# Patient Record
Sex: Female | Born: 1965 | Race: White | Hispanic: No | Marital: Married | Smoking: Never smoker
Health system: Southern US, Community
[De-identification: ages and names within clinical notes are randomized; demographics above are authoritative.]

## PROBLEM LIST (undated history)

## (undated) DIAGNOSIS — M797 Fibromyalgia: Secondary | ICD-10-CM

## (undated) DIAGNOSIS — G4730 Sleep apnea, unspecified: Secondary | ICD-10-CM

## (undated) DIAGNOSIS — M1990 Unspecified osteoarthritis, unspecified site: Secondary | ICD-10-CM

## (undated) DIAGNOSIS — T4145XA Adverse effect of unspecified anesthetic, initial encounter: Secondary | ICD-10-CM

## (undated) DIAGNOSIS — D649 Anemia, unspecified: Secondary | ICD-10-CM

## (undated) DIAGNOSIS — Z9889 Other specified postprocedural states: Secondary | ICD-10-CM

## (undated) DIAGNOSIS — I10 Essential (primary) hypertension: Secondary | ICD-10-CM

## (undated) DIAGNOSIS — I5030 Unspecified diastolic (congestive) heart failure: Secondary | ICD-10-CM

## (undated) DIAGNOSIS — F329 Major depressive disorder, single episode, unspecified: Secondary | ICD-10-CM

## (undated) DIAGNOSIS — K219 Gastro-esophageal reflux disease without esophagitis: Secondary | ICD-10-CM

## (undated) DIAGNOSIS — M329 Systemic lupus erythematosus, unspecified: Secondary | ICD-10-CM

## (undated) DIAGNOSIS — I2510 Atherosclerotic heart disease of native coronary artery without angina pectoris: Secondary | ICD-10-CM

## (undated) DIAGNOSIS — F419 Anxiety disorder, unspecified: Secondary | ICD-10-CM

## (undated) DIAGNOSIS — F32A Depression, unspecified: Secondary | ICD-10-CM

## (undated) DIAGNOSIS — Z8489 Family history of other specified conditions: Secondary | ICD-10-CM

## (undated) DIAGNOSIS — Z8719 Personal history of other diseases of the digestive system: Secondary | ICD-10-CM

## (undated) DIAGNOSIS — R112 Nausea with vomiting, unspecified: Secondary | ICD-10-CM

## (undated) DIAGNOSIS — T8859XA Other complications of anesthesia, initial encounter: Secondary | ICD-10-CM

## (undated) DIAGNOSIS — R0600 Dyspnea, unspecified: Secondary | ICD-10-CM

## (undated) DIAGNOSIS — IMO0002 Reserved for concepts with insufficient information to code with codable children: Secondary | ICD-10-CM

## (undated) DIAGNOSIS — R011 Cardiac murmur, unspecified: Secondary | ICD-10-CM

## (undated) HISTORY — PX: ENDOMETRIAL ABLATION: SHX621

## (undated) HISTORY — PX: DILATION AND CURETTAGE OF UTERUS: SHX78

## (undated) HISTORY — PX: TONSILLECTOMY: SUR1361

---
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
## 1991-01-28 HISTORY — PX: CHOLECYSTECTOMY: SHX55

## 1992-01-28 HISTORY — PX: WRIST SURGERY: SHX841

## 1994-01-27 HISTORY — PX: DIAGNOSTIC LAPAROSCOPY: SUR761

## 2007-08-12 ENCOUNTER — Ambulatory Visit: Payer: Self-pay | Admitting: Internal Medicine

## 2008-05-28 ENCOUNTER — Ambulatory Visit: Payer: Self-pay | Admitting: Internal Medicine

## 2010-02-07 IMAGING — CR DG CHEST 2V
1 series · 2 of 2 positions shown · non-contrast
Comparison: none

REASON FOR EXAM: cough
COMMENTS:

PROCEDURE:     MDR - MDR CHEST PA(OR AP) AND LATERAL  - August 12, 2007  [DATE]
RESULT:     The lung fields are clear. The heart, mediastinal and osseous
structures show no significant abnormalities.

[Series 1: view not recorded · 0.17mm/px · 2 of 2 slices shown]
[im 1/2]
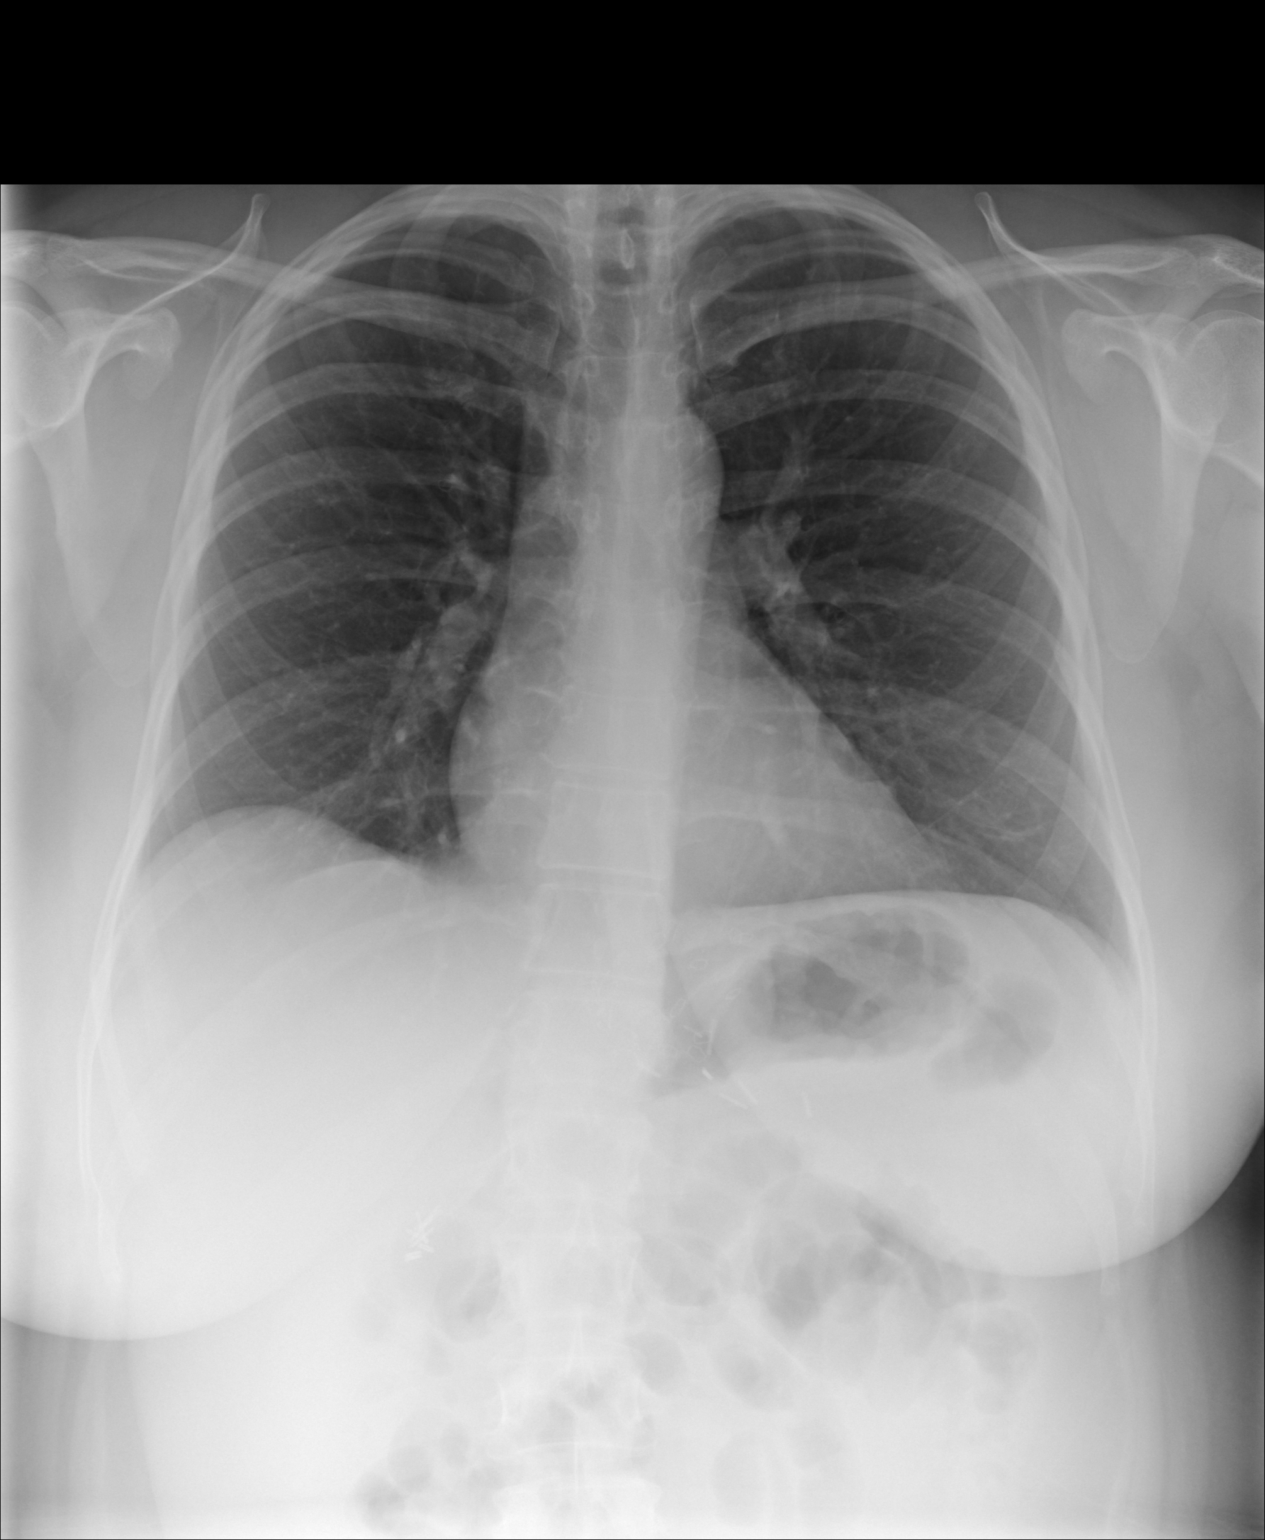
[im 2/2]
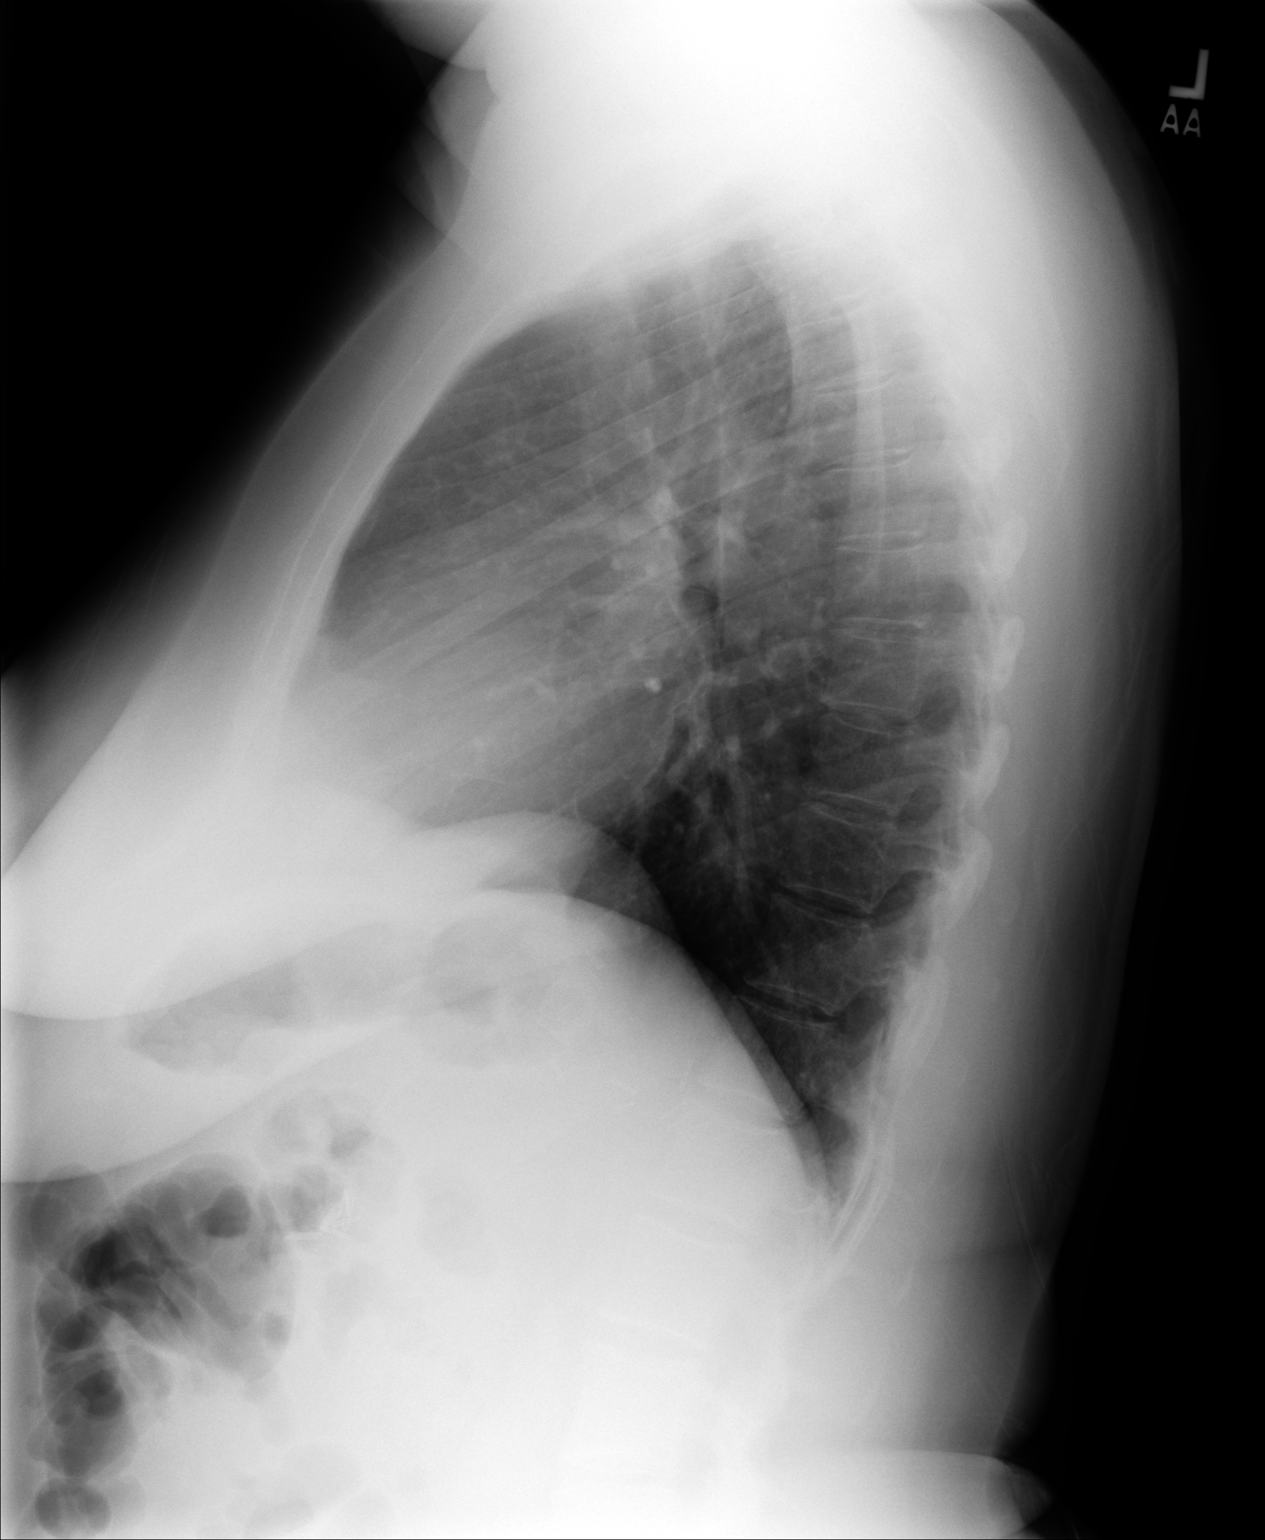

[2 of 2 positions shown; findings below may reference images not displayed]

IMPRESSION: No acute changes are identified.

## 2010-08-09 ENCOUNTER — Ambulatory Visit: Payer: Self-pay | Admitting: Internal Medicine

## 2010-10-03 ENCOUNTER — Ambulatory Visit: Payer: Self-pay | Admitting: Internal Medicine

## 2011-08-04 ENCOUNTER — Ambulatory Visit: Payer: Self-pay | Admitting: Emergency Medicine

## 2012-03-10 ENCOUNTER — Ambulatory Visit: Payer: Self-pay | Admitting: Family Medicine

## 2012-12-07 ENCOUNTER — Ambulatory Visit: Payer: Self-pay | Admitting: Emergency Medicine

## 2014-03-26 ENCOUNTER — Ambulatory Visit: Payer: Self-pay | Admitting: Family Medicine

## 2014-12-23 ENCOUNTER — Ambulatory Visit
Admission: EM | Admit: 2014-12-23 | Discharge: 2014-12-23 | Disposition: A | Discharge status: Home / Self Care | Payer: BLUE CROSS/BLUE SHIELD | Attending: Family Medicine | Admitting: Family Medicine

## 2014-12-23 DIAGNOSIS — J0100 Acute maxillary sinusitis, unspecified: Secondary | ICD-10-CM | POA: Diagnosis not present

## 2014-12-23 DIAGNOSIS — J0110 Acute frontal sinusitis, unspecified: Secondary | ICD-10-CM | POA: Diagnosis not present

## 2014-12-23 HISTORY — DX: Reserved for concepts with insufficient information to code with codable children: IMO0002

## 2014-12-23 HISTORY — DX: Fibromyalgia: M79.7

## 2014-12-23 HISTORY — DX: Essential (primary) hypertension: I10

## 2014-12-23 HISTORY — DX: Systemic lupus erythematosus, unspecified: M32.9

## 2014-12-23 MED ORDER — AZITHROMYCIN 500 MG PO TABS: 500.0000 mg | ORAL_TABLET | Freq: Every day | ORAL | Status: DC

## 2014-12-23 MED ORDER — GUAIFENESIN-CODEINE 100-10 MG/5ML PO SOLN: 5.0000 mL | Freq: Three times a day (TID) | ORAL | Status: AC | PRN

## 2014-12-23 NOTE — ED Provider Notes (Signed)
[facility]  ____________________________________________  Time seen: Approximately 8:33 AM  I have reviewed the triage vital signs and the nursing notes.   HISTORY  Chief Complaint Nasal Congestion   HPI Dana Potts is a 49 y.o. female presents with complaints of 3 weeks of runny nose, nasal congestion, sinus pressure and intermittent cough. Patient reports cough is primarily at night and especially when lying down she can feel the drainage in the back of her throat. Reports current sinus discomfort is described as a pressure and ache at 5 out of 10. Denies pain radiation. Denies dizziness, neck pain, rash, vision changes, chest pain or shortness of breath. Reports continues to eat and drink well.  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   Past Medical History  Diagnosis Date  . Lupus (HCC)   . Fibromyalgia   . Hypertension     There are no active problems to display for this patient.   Past Surgical History  Procedure Laterality Date  . Endometrial ablation    . Cesarean section    . Bunionectomy    . Tonsillectomy      Current Outpatient Rx  Name  Route  Sig  Dispense  Refill  . benazepril-hydrochlorthiazide (LOTENSIN HCT) 20-25 MG tablet   Oral   Take 1 tablet by mouth daily.         . DULoxetine (CYMBALTA) 60 MG capsule   Oral   Take 60 mg by mouth daily.         . folic acid (FOLVITE) 400 MCG tablet   Oral   Take 400 mcg by mouth daily.         . hydroxychloroquine (PLAQUENIL) 200 MG tablet   Oral   Take by mouth 2 (two) times daily.         Marland Kitchen losartan (COZAAR) 25 MG tablet   Oral   Take 25 mg by mouth daily.         . traZODone (DESYREL) 50 MG tablet   Oral   Take 50 mg by mouth at bedtime as needed for sleep.         . vitamin B-12 (CYANOCOBALAMIN) 1000 MCG tablet   Oral   Take 1,000 mcg by mouth daily.         . Vitamin D, Ergocalciferol, (DRISDOL) 50000  UNITS CAPS capsule   Oral   Take 50,000 Units by mouth every 7 (seven) days.           Allergies Penicillins  Penicillin allergy: Reaction "swelling "  Also reports doxycycline: States unable to tolerate due to GI discomfort.  History reviewed. No pertinent family history.  Social History Social History  Substance Use Topics  . Smoking status: Never Smoker   . Smokeless tobacco: None  . Alcohol Use: No    Review of Systems Constitutional: No fever/chills Eyes: No visual changes. ENT: No sore throat. Positive runny nose, congestion and sinus pressure.  Cardiovascular: Denies chest pain. Respiratory: Denies shortness of breath. Gastrointestinal: No abdominal pain.  No nausea, no vomiting.  No diarrhea.  No constipation. Genitourinary: Negative for dysuria. Musculoskeletal: Negative for back pain. Skin: Negative for rash. Neurological: Negative for headaches, focal weakness or numbness.  10-point ROS otherwise negative.  ____________________________________________   PHYSICAL EXAM:  VITAL SIGNS: ED Triage Vitals  Enc Vitals Group     BP 12/23/14 0826 140/73 mmHg     Pulse Rate 12/23/14 0826 81  Resp -- 18     Temp 12/23/14 0826 97.5 F (36.4 C)     Temp Source 12/23/14 0826 Oral     SpO2 12/23/14 0826 98 %     Weight 12/23/14 0826 192 lb (87.091 kg)     Height 12/23/14 0826 5' (1.524 m)     Head Cir --      Peak Flow --      Pain Score 12/23/14 0825 2     Pain Loc --      Pain Edu? --      Excl. in GC? --     Constitutional: Alert and oriented. Well appearing and in no acute distress. Eyes: Conjunctivae are normal. PERRL. EOMI. Head: Atraumatic. Mod TTP bilateral maxillary and frontal sinus, no swelling, no erythema.   Ears: no erythema, normal TMs bilaterally.   Nose: nasal congestion, bilateral nasal turbinate erythema.   Mouth/Throat: Mucous membranes are moist.  Oropharynx non-erythematous.No tonsillar swelling or exudate. No uvular shift or  deviation.  Neck: No stridor.  No cervical spine tenderness to palpation. Hematological/Lymphatic/Immunilogical: No cervical lymphadenopathy. Cardiovascular: Normal rate, regular rhythm. Grossly normal heart sounds.  Good peripheral circulation. Respiratory: Normal respiratory effort.  No retractions. Lungs CTAB.No wheezes, rales or rhonchi. Good air movement.  Gastrointestinal: Soft and nontender. Obese abdomen. Normal Bowel sounds.   No CVA tenderness. Musculoskeletal: No lower or upper extremity tenderness nor edema.  No joint effusions. Bilateral pedal pulses equal and easily palpated.  Neurologic:  Normal speech and language. No gross focal neurologic deficits are appreciated. No gait instability. Skin:  Skin is warm, dry and intact. No rash noted. Psychiatric: Mood and affect are normal. Speech and behavior are normal.  ____________________________________________   LABS (all labs ordered are listed, but only abnormal results are displayed)  Labs Reviewed - No data to display ____________________________________________   INITIAL IMPRESSION / ASSESSMENT AND PLAN / ED COURSE  Pertinent labs & imaging results that were available during my care of the patient were reviewed by me and considered in my medical decision making (see chart for details).  Very well appearing. No acute distress. 3 weeks runny nose, nasal congestion and sinus discomfort. Lungs clear throughout will treat sinusitis with oral azithromycin, and prn guaifenesin/codeine. Supportive treatments including rest, fluids, and pcp follow up.   Discussed follow up with Primary care physician this week. Discussed follow up and return parameters including no resolution or any worsening concerns. Patient verbalized understanding and agreed to plan.   ____________________________________________   FINAL CLINICAL IMPRESSION(S) / ED DIAGNOSES  Final diagnoses:  Acute frontal sinusitis, recurrence not specified  Acute  maxillary sinusitis, recurrence not specified       Renford DillsLindsey Keven Osborn, NP 12/23/14 [phone]

## 2014-12-23 NOTE — Discharge Instructions (Signed)
Take medication as prescribed. Rest. Drink plenty of fluids.  ° °Follow up with your primary care physician this week as needed. Return to Urgent care as needed for new or worsening concerns.  ° °Sinusitis, Adult °Sinusitis is redness, soreness, and inflammation of the paranasal sinuses. Paranasal sinuses are air pockets within the bones of your face. They are located beneath your eyes, in the middle of your forehead, and above your eyes. In healthy paranasal sinuses, mucus is able to drain out, and air is able to circulate through them by way of your nose. However, when your paranasal sinuses are inflamed, mucus and air can become trapped. This can allow bacteria and other germs to grow and cause infection. °Sinusitis can develop quickly and last only a short time (acute) or continue over a long period (chronic). Sinusitis that lasts for more than 12 weeks is considered chronic. °CAUSES °Causes of sinusitis include: °· Allergies. °· Structural abnormalities, such as displacement of the cartilage that separates your nostrils (deviated septum), which can decrease the air flow through your nose and sinuses and affect sinus drainage. °· Functional abnormalities, such as when the small hairs (cilia) that line your sinuses and help remove mucus do not work properly or are not present. °SIGNS AND SYMPTOMS °Symptoms of acute and chronic sinusitis are the same. The primary symptoms are pain and pressure around the affected sinuses. Other symptoms include: °· Upper toothache. °· Earache. °· Headache. °· Bad breath. °· Decreased sense of smell and taste. °· A cough, which worsens when you are lying flat. °· Fatigue. °· Fever. °· Thick drainage from your nose, which often is green and may contain pus (purulent). °· Swelling and warmth over the affected sinuses. °DIAGNOSIS °Your health care provider will perform a physical exam. During your exam, your health care provider may perform any of the following to help determine if  you have acute sinusitis or chronic sinusitis: °· Look in your nose for signs of abnormal growths in your nostrils (nasal polyps). °· Tap over the affected sinus to check for signs of infection. °· View the inside of your sinuses using an imaging device that has a light attached (endoscope). °If your health care provider suspects that you have chronic sinusitis, one or more of the following tests may be recommended: °· Allergy tests. °· Nasal culture. A sample of mucus is taken from your nose, sent to a lab, and screened for bacteria. °· Nasal cytology. A sample of mucus is taken from your nose and examined by your health care provider to determine if your sinusitis is related to an allergy. °TREATMENT °Most cases of acute sinusitis are related to a viral infection and will resolve on their own within 10 days. Sometimes, medicines are prescribed to help relieve symptoms of both acute and chronic sinusitis. These may include pain medicines, decongestants, nasal steroid sprays, or saline sprays. °However, for sinusitis related to a bacterial infection, your health care provider will prescribe antibiotic medicines. These are medicines that will help kill the bacteria causing the infection. °Rarely, sinusitis is caused by a fungal infection. In these cases, your health care provider will prescribe antifungal medicine. °For some cases of chronic sinusitis, surgery is needed. Generally, these are cases in which sinusitis recurs more than 3 times per year, despite other treatments. °HOME CARE INSTRUCTIONS °· Drink plenty of water. Water helps thin the mucus so your sinuses can drain more easily. °· Use a humidifier. °· Inhale steam 3-4 times a day (for example, sit   in the bathroom with the shower running). °· Apply a warm, moist washcloth to your face 3-4 times a day, or as directed by your health care provider. °· Use saline nasal sprays to help moisten and clean your sinuses. °· Take medicines only as directed by your  health care provider. °· If you were prescribed either an antibiotic or antifungal medicine, finish it all even if you start to feel better. °SEEK IMMEDIATE MEDICAL CARE IF: °· You have increasing pain or severe headaches. °· You have nausea, vomiting, or drowsiness. °· You have swelling around your face. °· You have vision problems. °· You have a stiff neck. °· You have difficulty breathing. °  °This information is not intended to replace advice given to you by your health care provider. Make sure you discuss any questions you have with your health care provider. °  °Document Released: 01/13/2005 Document Revised: 02/03/2014 Document Reviewed: 01/28/2011 °Elsevier Interactive Patient Education ©2016 [employer] ° °

## 2015-07-08 ENCOUNTER — Ambulatory Visit
Admission: EM | Admit: 2015-07-08 | Discharge: 2015-07-08 | Disposition: A | Discharge status: Home / Self Care | Payer: BLUE CROSS/BLUE SHIELD | Attending: Family Medicine | Admitting: Family Medicine

## 2015-07-08 DIAGNOSIS — J0100 Acute maxillary sinusitis, unspecified: Secondary | ICD-10-CM | POA: Diagnosis not present

## 2015-07-08 MED ORDER — AZITHROMYCIN 250 MG PO TABS: ORAL_TABLET | ORAL | Status: DC

## 2015-07-08 NOTE — ED Provider Notes (Signed)
CSN: [id number]     Arrival date & time 07/08/15  1011 History   First MD Initiated Contact with Patient 07/08/15 1038     Chief Complaint  Patient presents with  . Sinusitis   (Consider location/radiation/quality/duration/timing/severity/associated sxs/prior Treatment) Patient is a 50 y.o. female presenting with URI. The history is provided by the patient.  URI Presenting symptoms: congestion, cough, facial pain, fatigue and fever   Severity:  Moderate Onset quality:  Sudden Duration:  2 weeks Timing:  Constant Progression:  Worsening Chronicity:  New Relieved by:  Nothing Ineffective treatments:  OTC medications Associated symptoms: headaches and sinus pain   Associated symptoms: no myalgias, no swollen glands and no wheezing   Risk factors: immunosuppression (immunosuppresants for lupus)   Risk factors: not elderly, no chronic cardiac disease, no chronic kidney disease, no chronic respiratory disease, no diabetes mellitus, no recent illness, no recent travel and no sick contacts     Past Medical History  Diagnosis Date  . Lupus (HCC)   . Fibromyalgia   . Hypertension    Past Surgical History  Procedure Laterality Date  . Endometrial ablation    . Cesarean section    . Bunionectomy    . Tonsillectomy     Family History  Problem Relation Age of Onset  . Lung cancer Father   . Hypothyroidism Mother   . Anxiety disorder Mother   . Hypertension Mother   . Glaucoma Mother    Social History  Substance Use Topics  . Smoking status: Never Smoker   . Smokeless tobacco: None  . Alcohol Use: No   OB History    No data available     Review of Systems  Constitutional: Positive for fever and fatigue.  HENT: Positive for congestion.   Respiratory: Positive for cough. Negative for wheezing.   Musculoskeletal: Negative for myalgias.  Neurological: Positive for headaches.    Allergies  Penicillins  Home Medications   Prior to Admission medications   Medication  Sig Start Date End Date Taking? Authorizing Provider  benazepril-hydrochlorthiazide (LOTENSIN HCT) 20-25 MG tablet Take 1 tablet by mouth daily.   Yes Historical Provider, MD  DULoxetine (CYMBALTA) 60 MG capsule Take 60 mg by mouth daily.   Yes Historical Provider, MD  folic acid (FOLVITE) 400 MCG tablet Take 400 mcg by mouth daily.   Yes Historical Provider, MD  hydroxychloroquine (PLAQUENIL) 200 MG tablet Take by mouth 2 (two) times daily.   Yes Historical Provider, MD  losartan (COZAAR) 25 MG tablet Take 25 mg by mouth daily.   Yes Historical Provider, MD  Methotrexate, PF, 25 MG/0.4ML SOAJ Inject into the skin.   Yes Historical Provider, MD  traZODone (DESYREL) 50 MG tablet Take 50 mg by mouth at bedtime as needed for sleep.   Yes Historical Provider, MD  vitamin B-12 (CYANOCOBALAMIN) 1000 MCG tablet Take 1,000 mcg by mouth daily.   Yes Historical Provider, MD  Vitamin D, Ergocalciferol, (DRISDOL) 50000 UNITS CAPS capsule Take 50,000 Units by mouth every 7 (seven) days.   Yes Historical Provider, MD  azithromycin (ZITHROMAX Z-PAK) 250 MG tablet 2 tabs po once day 1, then 1 tab po qd for next 4 days 07/08/15   Payton Mccallumrlando Yiannis Tulloch, MD  guaiFENesin-codeine 100-10 MG/5ML syrup Take 5 mLs by mouth 3 (three) times daily as needed for cough. 12/23/14   Renford DillsLindsey Miller, NP   Meds Ordered and Administered this Visit  Medications - No data to display  BP 128/75 mmHg  Pulse 77  Temp(Src) 97.2 F (36.2 C) (Tympanic)  Resp 18  Ht 5' (1.524 m)  Wt 186 lb (84.369 kg)  BMI 36.33 kg/m2  SpO2 99% No data found.   Physical Exam  Constitutional: She appears well-developed and well-nourished. No distress.  HENT:  Head: Normocephalic and atraumatic.  Right Ear: Tympanic membrane, external ear and ear canal normal.  Left Ear: Tympanic membrane, external ear and ear canal normal.  Nose: Mucosal edema and rhinorrhea present. No nose lacerations, sinus tenderness, nasal deformity, septal deviation or nasal  septal hematoma. No epistaxis.  No foreign bodies. Right sinus exhibits maxillary sinus tenderness and frontal sinus tenderness. Left sinus exhibits maxillary sinus tenderness and frontal sinus tenderness.  Mouth/Throat: Uvula is midline, oropharynx is clear and moist and mucous membranes are normal. No oropharyngeal exudate.  Eyes: Conjunctivae and EOM are normal. Pupils are equal, round, and reactive to light. Right eye exhibits no discharge. Left eye exhibits no discharge. No scleral icterus.  Neck: Normal range of motion. Neck supple. No thyromegaly present.  Cardiovascular: Normal rate, regular rhythm and normal heart sounds.   Pulmonary/Chest: Effort normal and breath sounds normal. No respiratory distress. She has no wheezes. She has no rales.  Lymphadenopathy:    She has no cervical adenopathy.  Skin: She is not diaphoretic.  Nursing note and vitals reviewed.   ED Course  Procedures (including critical care time)  Labs Review Labs Reviewed - No data to display  Imaging Review No results found.   Visual Acuity Review  Right Eye Distance:   Left Eye Distance:   Bilateral Distance:    Right Eye Near:   Left Eye Near:    Bilateral Near:         MDM   1. Acute maxillary sinusitis, recurrence not specified    Discharge Medication List as of 07/08/2015 10:49 AM     1.diagnosis reviewed with patient 2. rx as per orders above; reviewed possible side effects, interactions, risks and benefits; zithromax as per orders 3.Follow-up prn if symptoms worsen or don't improve    Payton Mccallum, MD 07/08/15 1140

## 2015-07-08 NOTE — Discharge Instructions (Signed)

## 2015-07-08 NOTE — ED Notes (Signed)
Patient complains of sinus pain and pressure, sore throat, cough- with productivity, runny nose. Patient states that this has been occurring for 1.5 weeks.

## 2016-02-26 ENCOUNTER — Ambulatory Visit (INDEPENDENT_AMBULATORY_CARE_PROVIDER_SITE_OTHER): Payer: BLUE CROSS/BLUE SHIELD | Admitting: Podiatry

## 2016-02-26 ENCOUNTER — Ambulatory Visit (INDEPENDENT_AMBULATORY_CARE_PROVIDER_SITE_OTHER): Payer: BLUE CROSS/BLUE SHIELD

## 2016-02-26 ENCOUNTER — Encounter: Payer: Self-pay | Admitting: Podiatry

## 2016-02-26 DIAGNOSIS — M2042 Other hammer toe(s) (acquired), left foot: Secondary | ICD-10-CM | POA: Diagnosis not present

## 2016-02-26 DIAGNOSIS — M2012 Hallux valgus (acquired), left foot: Secondary | ICD-10-CM | POA: Diagnosis not present

## 2016-02-26 DIAGNOSIS — M21612 Bunion of left foot: Secondary | ICD-10-CM | POA: Diagnosis not present

## 2016-02-26 NOTE — Patient Instructions (Signed)

## 2016-03-08 NOTE — Progress Notes (Signed)
   Subjective:  Patient presents today as a new patient for evaluation of bunions to the left foot. Patient is otherwise healthy and states that when she was 51 years old she had surgery performed by an orthopedist. Patient has complained of this painful bunion deformity for several years now without any alleviation of symptoms for the patient regarding conservative modalities which would include shoe gear modifications and padding. Patient is unable walk great distances without pain.    Objective/Physical Exam General: The patient is alert and oriented x3 in no acute distress.  Dermatology: Skin is warm, dry and supple bilateral lower extremities. Negative for open lesions or macerations.  Vascular: Palpable pedal pulses bilaterally. No edema or erythema noted. Capillary refill within normal limits.  Neurological: Epicritic and protective threshold grossly intact bilaterally.   Musculoskeletal Exam: Clinical evidence of bunion deformity noted to the left foot with hammertoe contracture second digit left foot. There is limited range of motion of the first MPJ left foot.  Radiographic Exam:  Hallux abductovalgus deformity noted with an intermetatarsal angle greater than 15. Hallux abductus angle greater than 30. 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 #2 today we discussed conservative versus surgical management of bunion and hammertoe deformity. Due to the fact that the patient has failed conservative modalities at home to alleviate symptoms she opts for surgical correction. All possible complications and details of the procedure were explained. All patient questions were answered. No guarantees were expressed or implied. #3 authorization for surgery was initiated today. Surgery will consist of bunionectomy with metatarsal osteotomy  left. PIPJ arthroplasty second digit left. Second MPJ capsulotomy left. #4 return to clinic 1 week postop  [place]   Savva Beamer M. Aviannah Castoro, [place] [facility]  Dr. Felecia ShellingBrent M. Luciano Cinquemani, DPM    [address]                                        [place], [place] [address]                Office [phone]  Fax [phone]

## 2016-08-12 ENCOUNTER — Ambulatory Visit: Payer: BLUE CROSS/BLUE SHIELD | Admitting: Podiatry

## 2016-09-08 ENCOUNTER — Ambulatory Visit (INDEPENDENT_AMBULATORY_CARE_PROVIDER_SITE_OTHER): Payer: BLUE CROSS/BLUE SHIELD

## 2016-09-08 ENCOUNTER — Encounter: Payer: Self-pay | Admitting: Podiatry

## 2016-09-08 ENCOUNTER — Ambulatory Visit (INDEPENDENT_AMBULATORY_CARE_PROVIDER_SITE_OTHER): Payer: BLUE CROSS/BLUE SHIELD | Admitting: Podiatry

## 2016-09-08 DIAGNOSIS — M2012 Hallux valgus (acquired), left foot: Secondary | ICD-10-CM

## 2016-09-08 DIAGNOSIS — M21612 Bunion of left foot: Secondary | ICD-10-CM

## 2016-09-08 DIAGNOSIS — M2042 Other hammer toe(s) (acquired), left foot: Secondary | ICD-10-CM | POA: Diagnosis not present

## 2016-09-08 NOTE — Progress Notes (Signed)
   Subjective:  51 year old female presents today for follow-up evaluation regarding bunions and hammertoes to the left foot. Patient states that she when she was partially 51 years old she had bunion surgery performed by an orthopedist. Patient has complained of painful bunion deformity for several years now without any alleviation of symptoms despite all conservative modalities. Patient states that over the past 6 months she had family medical issues and has been unable to have surgery. Patient presents today for follow-up treatment and evaluation   Objective/Physical Exam General: The patient is alert and oriented x3 in no acute distress.  Dermatology: Skin is warm, dry and supple bilateral lower extremities. Negative for open lesions or macerations.  Vascular: Palpable pedal pulses bilaterally. No edema or erythema noted. Capillary refill within normal limits.  Neurological: Epicritic and protective threshold grossly intact bilaterally.   Musculoskeletal Exam: Clinical evidence of bunion deformity noted to the left foot with hammertoe contracture second digit left foot. There is limited range of motion of the first MPJ left foot.  Radiographic Exam:  Hallux abductovalgus deformity noted with an intermetatarsal angle greater than 15. Hallux abductus angle greater than 30. 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 #2 today we discussed conservative versus surgical management of bunion and hammertoe deformity again . All possible complications and details of the procedure were explained. All patient questions were answered. No guarantees were expressed or implied. #3 authorization for surgery was re-initiated today. Surgery will consist of bunionectomy with metatarsal osteotomy left. PIPJ arthroplasty second digit left. Second MPJ  capsulotomy left. possible Weil osteotomy second metatarsal left foot #4 return to clinic 1 week postop  [place] [facility]   Brent M. Evans, [place] [facility]  Dr. Felecia ShellingBrent M. Evans, DPM    [address]                                        [place], [place] [address]                Office [phone]  Fax [phone]

## 2016-09-11 ENCOUNTER — Telehealth: Payer: Self-pay | Admitting: Podiatry

## 2016-09-11 NOTE — Telephone Encounter (Signed)
I'm scheduled to have surgery on 13 September and [employer] and [employer] Pouchet[employer] will be faxing over her paperwork. She would like for you to call her when you have completed it so she can pay the $25.00 fee. Thank you.

## 2016-09-23 DIAGNOSIS — M79676 Pain in unspecified toe(s): Secondary | ICD-10-CM

## 2016-10-07 ENCOUNTER — Telehealth: Payer: Self-pay | Admitting: *Deleted

## 2016-10-07 NOTE — Telephone Encounter (Signed)
"  I just left the hospital.  I need to cancel my surgery for Thursday.  They said something is wrong with my heart.  I am in shock.  I don't want anything to happen while I'm having the surgery.  I don't know what I need to do."  I will let Dr. Logan BoresEvans know and call the surgical center and cancel your surgery.  "I hate to do this to you all."  No apology needed, your health is the main concern.  I hope everything goes well for you.  [facility], I canceled patient's surgery for 10/09/2016.

## 2016-10-17 ENCOUNTER — Encounter: Payer: BLUE CROSS/BLUE SHIELD | Admitting: Podiatry

## 2016-10-24 ENCOUNTER — Encounter: Payer: BLUE CROSS/BLUE SHIELD | Admitting: Podiatry

## 2016-10-27 HISTORY — PX: CARDIAC CATHETERIZATION: SHX172

## 2016-11-24 ENCOUNTER — Telehealth: Payer: Self-pay | Admitting: *Deleted

## 2016-11-24 NOTE — Telephone Encounter (Signed)
"  I was supposed to have surgery on my foot with Dr. Logan BoresEvans on September 13.  I had to cancel due to some medical problems that got resolved.  I didn't know if you could call me back because I didn't know if I have to go through everything or if I could just schedule the surgery.  Call me back."

## 2016-11-25 ENCOUNTER — Telehealth: Payer: Self-pay | Admitting: Podiatry

## 2016-11-25 NOTE — Telephone Encounter (Signed)
Patient called and said that she has left 3 messages for someone to call her back about scheduling surgery since Friday. Apparently she was scheduled for 10/09/2016 and had to cancel that because of a heart problem she was having but that she has now been cleared from that and is ready to get her surgery scheduled. Patient states she has met her deductible and would like to get this done before year end.

## 2016-11-26 NOTE — Telephone Encounter (Signed)
I attempted to return her call.  I left her a message to call me back tomorrow. 

## 2016-11-27 NOTE — Telephone Encounter (Signed)
I'm returning your call.  Do you have a date in mind that you would like to do your surgery on?  "I would like to do it as soon as possible.  Can he do it on November 29?"  Yes, he can do it on November 29.    "I'm sorry to bother you again.  When I was at my heart doctor, he had said that everything would be fine for me to have the surgery but he would prefer I have it done in the hospital.  I had mentioned it to whomever I had spoken with earlier this week but I forgot to mention it to you.  I don't know if that will change the date or will I need to call somewhere else.  Let me know please."

## 2016-11-27 NOTE — Telephone Encounter (Signed)
I'm returning your call.  He may be able to do your surgery at [facility] on November 29.  I have to call and see if they have any time available.  "Since it's going to be done at [facility], can he do it any sooner?"  He may be able to do it on November 15 but have you had a physical within the past 30 days by your primary care physician?  [facility] requires a history and physical form be completed prior to any surgeries.  "I have seen my primary care doctor about five times within last month."  You will need to get a history and physical form from our office and have your primary care doctor fill it out and fax to the surgery center.  I will call on Monday to see if they have any time available on November 15.  If I have any problems, I will call and let you know.  "Okay, I will go by the [place] office to pick up the forms tomorrow."

## 2016-11-27 NOTE — Telephone Encounter (Signed)
"  I got a phone call from you last night.  I'm sorry I missed your call.  If you would, call me back please."

## 2016-12-03 NOTE — Telephone Encounter (Signed)
I'm calling to let you know we were able to get you scheduled for November 15 for your surgery.  It will be at the [facility] OR.  "Okay, sounds good.  I have questions about my FMLA.  I would think we would have to do all that over again.  I'm going to check with my job and see what they say."  You will need to speak to Algis GreenhouseJanet Albert regarding your FMLA.  "How do I contact her?"  You can just call the main office number and ask for her.

## 2016-12-04 NOTE — Telephone Encounter (Signed)
Forms placed at front desk for patient to pick up

## 2016-12-05 ENCOUNTER — Telehealth: Payer: Self-pay | Admitting: *Deleted

## 2016-12-05 NOTE — Telephone Encounter (Signed)
"  This is Medical illustratorTeresa RN for [facility] Pre-admission Testing.  Patient of Dr. Logan BoresEvans is coming in on Tuesday for a pre-admission appointment on Tuesday the 13th.  There are no pre-op orders in epic.  They need to be put in as soon as possible.  We have to have these orders in so we can act upon them on her pre-admission visit.  The patient's name is Dana Potts.  Her date of surgery is the 15th but her pre-admission is the 13th.  Please have Dr. Logan BoresEvans get orders in as soon as possible."

## 2016-12-08 ENCOUNTER — Telehealth: Payer: Self-pay | Admitting: *Deleted

## 2016-12-08 ENCOUNTER — Telehealth: Payer: Self-pay | Admitting: Podiatry

## 2016-12-08 NOTE — Telephone Encounter (Signed)
Hey, my name is Dana Potts. I'm calling from [facility]. This pt is scheduled for surgery on Thursday 15 November and she is scheduled to come in tomorrow for her pre-op appointment. We currently do not have orders for this pt. If you have any questions, please give us a call back at [phone]. Thank you.

## 2016-12-08 NOTE — Telephone Encounter (Signed)
Pre-op orders placed

## 2016-12-08 NOTE — Telephone Encounter (Signed)
"  I'm calling about Dana Potts, a patient of Dr. Logan BoresEvans whose going to have surgery on the 15th of November but they are coming in for their pre-op appointment tomorrow, the 13.  We currently don't have orders for this patient.  If you have any questions give us a call back."

## 2016-12-08 NOTE — Pre-Procedure Instructions (Signed)
Dana Potts  12/08/2016      [id number] [place] [place] - [address] [place] [address] Phone: [phone] Fax: [phone]    Your procedure is scheduled on November 15  [facility] to [facility] Admitting at 1300 P.M.  Call this number if you have problems the morning of surgery:  [phone]   Remember:  Do not eat food or drink liquids after midnight.  Continue all other medications as directed by your physician except follow these medication instructions before surgery   Take these medicines the morning of surgery with A SIP OF WATER  clonazePAM (KLONOPIN)  metoprolol succinate (TOPROL-XL)  omeprazole (PRILOSEC)   7 days prior to surgery STOP taking any Aspirin (unless otherwise instructed by your surgeon), Aleve, Naproxen, Ibuprofen, Motrin, Advil, Goody's, BC's, all herbal medications, fish oil, and all vitamins    Do not wear jewelry, make-up or nail polish.  Do not wear lotions, powders, or perfumes, or deoderant.  Do not shave 48 hours prior to surgery.    Do not bring valuables to the hospital.  [facility] is not responsible for any belongings or valuables.  Contacts, dentures or bridgework may not be worn into surgery.  Leave your suitcase in the car.  After surgery it may be brought to your room.  For patients admitted to the hospital, discharge time will be determined by your treatment team.  Patients discharged the day of surgery will not be allowed to drive home.    Special instructions:   [place]- Preparing For Surgery  Before surgery, you can play an important role. Because skin is not sterile, your skin needs to be as free of germs as possible. You can reduce the number of germs on your skin by washing with CHG (chlorahexidine gluconate) Soap before surgery.  CHG is an antiseptic cleaner which kills germs and bonds with the skin to continue killing germs even after washing.  Please do not use  if you have an allergy to CHG or antibacterial soaps. If your skin becomes reddened/irritated stop using the CHG.  Do not shave (including legs and underarms) for at least 48 hours prior to first CHG shower. It is OK to shave your face.  Please follow these instructions carefully.   1. Shower the NIGHT BEFORE SURGERY and the MORNING OF SURGERY with CHG.   2. If you chose to wash your hair, wash your hair first as usual with your normal shampoo.  3. After you shampoo, rinse your hair and body thoroughly to remove the shampoo.  4. Use CHG as you would any other liquid soap. You can apply CHG directly to the skin and wash gently with a scrungie or a clean washcloth.   5. Apply the CHG Soap to your body ONLY FROM THE NECK DOWN.  Do not use on open wounds or open sores. Avoid contact with your eyes, ears, mouth and genitals (private parts). Wash Face and genitals (private parts)  with your normal soap.  6. Wash thoroughly, paying special attention to the area where your surgery will be performed.  7. Thoroughly rinse your body with warm water from the neck down.  8. DO NOT shower/wash with your normal soap after using and rinsing off the CHG Soap.  9. Pat yourself dry with a CLEAN TOWEL.  10. Wear CLEAN PAJAMAS to bed the night before surgery, wear comfortable clothes the morning of surgery  11. Place CLEAN SHEETS  on your bed the night of your first shower and DO NOT SLEEP WITH PETS.    Day of Surgery: Do not apply any deodorants/lotions. Please wear clean clothes to the hospital/surgery center.      Please read over the following fact sheets that you were given.

## 2016-12-09 ENCOUNTER — Other Ambulatory Visit: Payer: Self-pay

## 2016-12-09 ENCOUNTER — Encounter (HOSPITAL_COMMUNITY): Payer: Self-pay

## 2016-12-09 ENCOUNTER — Encounter (HOSPITAL_COMMUNITY)
Admission: RE | Admit: 2016-12-09 | Discharge: 2016-12-09 | Disposition: A | Discharge status: Home / Self Care | Payer: BLUE CROSS/BLUE SHIELD | Source: Ambulatory Visit | Attending: Podiatry | Admitting: Podiatry

## 2016-12-09 DIAGNOSIS — I110 Hypertensive heart disease with heart failure: Secondary | ICD-10-CM | POA: Diagnosis not present

## 2016-12-09 DIAGNOSIS — M797 Fibromyalgia: Secondary | ICD-10-CM | POA: Diagnosis not present

## 2016-12-09 DIAGNOSIS — G4730 Sleep apnea, unspecified: Secondary | ICD-10-CM | POA: Diagnosis not present

## 2016-12-09 DIAGNOSIS — K219 Gastro-esophageal reflux disease without esophagitis: Secondary | ICD-10-CM | POA: Diagnosis not present

## 2016-12-09 DIAGNOSIS — I509 Heart failure, unspecified: Secondary | ICD-10-CM | POA: Diagnosis not present

## 2016-12-09 DIAGNOSIS — M2012 Hallux valgus (acquired), left foot: Secondary | ICD-10-CM | POA: Diagnosis not present

## 2016-12-09 DIAGNOSIS — D649 Anemia, unspecified: Secondary | ICD-10-CM | POA: Diagnosis not present

## 2016-12-09 DIAGNOSIS — M2042 Other hammer toe(s) (acquired), left foot: Secondary | ICD-10-CM | POA: Diagnosis present

## 2016-12-09 DIAGNOSIS — I2510 Atherosclerotic heart disease of native coronary artery without angina pectoris: Secondary | ICD-10-CM | POA: Diagnosis not present

## 2016-12-09 DIAGNOSIS — M1990 Unspecified osteoarthritis, unspecified site: Secondary | ICD-10-CM | POA: Diagnosis not present

## 2016-12-09 DIAGNOSIS — K449 Diaphragmatic hernia without obstruction or gangrene: Secondary | ICD-10-CM | POA: Diagnosis not present

## 2016-12-09 HISTORY — DX: Family history of other specified conditions: Z84.89

## 2016-12-09 HISTORY — DX: Other complications of anesthesia, initial encounter: T88.59XA

## 2016-12-09 HISTORY — DX: Unspecified osteoarthritis, unspecified site: M19.90

## 2016-12-09 HISTORY — DX: Anxiety disorder, unspecified: F41.9

## 2016-12-09 HISTORY — DX: Sleep apnea, unspecified: G47.30

## 2016-12-09 HISTORY — DX: Depression, unspecified: F32.A

## 2016-12-09 HISTORY — DX: Dyspnea, unspecified: R06.00

## 2016-12-09 HISTORY — DX: Adverse effect of unspecified anesthetic, initial encounter: T41.45XA

## 2016-12-09 HISTORY — DX: Atherosclerotic heart disease of native coronary artery without angina pectoris: I25.10

## 2016-12-09 HISTORY — DX: Other specified postprocedural states: Z98.890

## 2016-12-09 HISTORY — DX: Major depressive disorder, single episode, unspecified: F32.9

## 2016-12-09 HISTORY — DX: Personal history of other diseases of the digestive system: Z87.19

## 2016-12-09 HISTORY — DX: Nausea with vomiting, unspecified: R11.2

## 2016-12-09 HISTORY — DX: Cardiac murmur, unspecified: R01.1

## 2016-12-09 HISTORY — DX: Gastro-esophageal reflux disease without esophagitis: K21.9

## 2016-12-09 HISTORY — DX: Anemia, unspecified: D64.9

## 2016-12-09 LAB — BASIC METABOLIC PANEL
Anion gap: 9 (ref 5–15)
BUN: 12 mg/dL (ref 6–20)
CO2: 27 mmol/L (ref 22–32)
Calcium: 9.1 mg/dL (ref 8.9–10.3)
Chloride: 104 mmol/L (ref 101–111)
Creatinine, Ser: 0.79 mg/dL (ref 0.44–1.00)
GFR calc Af Amer: >60 mL/min (ref >60)
Glucose, Bld: 93 mg/dL (ref 65–99)
POTASSIUM: 3.5 mmol/L (ref 3.5–5.1)
SODIUM: 140 mmol/L (ref 135–145)

## 2016-12-09 LAB — CBC
HEMATOCRIT: 43.1 % (ref 36.0–46.0)
Hemoglobin: 14.7 g/dL (ref 12.0–15.0)
MCH: 30.9 pg (ref 26.0–34.0)
MCHC: 34.1 g/dL (ref 30.0–36.0)
MCV: 90.5 fL (ref 78.0–100.0)
Platelets: 325 10*3/uL (ref 150–400)
RBC: 4.76 MIL/uL (ref 3.87–5.11)
RDW: 12.6 % (ref 11.5–15.5)
WBC: 7.7 10*3/uL (ref 4.0–10.5)

## 2016-12-09 NOTE — Progress Notes (Signed)
Started Lexapro 12/08/2016, instructed to take DOS.  Pt. Denies any changes in her breathing/ chest at present.  Requesting EKG tracing from Dr. Harriett RushS. Wood, via fax. Call to A. Kabbe,DNP- chart will be reviewed. Cardiac cath done recently as well as sleep eval.- available in Care everywhere.

## 2016-12-09 NOTE — Pre-Procedure Instructions (Signed)
Tommi EmeryMichele Armbruster Kanner  12/09/2016      [id number] [place], [place] - [address] [place] [address] Phone: [phone] Fax: [phone]    Your procedure is scheduled on November 15  [facility] Ad[facility] at 1300 P.M.  Call this number if you have problems the morning of surgery:  [phone]   Remember:  Do not eat food or drink liquids after midnight.  Continue all other medications as directed by your physician except follow these medication instructions before surgery   Take these medicines the morning of surgery with A SIP OF WATER: clonazePAM (KLONOPIN)  omeprazole (PRILOSEC)   7 days prior to surgery STOP taking any Aspirin (unless otherwise instructed by your surgeon), Aleve, Naproxen, Ibuprofen, Motrin, Advil, Goody's, BC's, all herbal medications, fish oil, and all vitamins    Do not wear jewelry, make-up or nail polish.  Do not wear lotions, powders, or perfumes, or deoderant.  Do not shave 48 hours prior to surgery.    Do not bring valuables to the hospital.  [facility] is not responsible for any belongings or valuables.  Contacts, dentures or bridgework may not be worn into surgery.  Leave your suitcase in the car.  After surgery it may be brought to your room.  For patients admitted to the hospital, discharge time will be determined by your treatment team.  Patients discharged the day of surgery will not be allowed to drive home.    Special instructions:   [place]- Preparing For Surgery  Before surgery, you can play an important role. Because skin is not sterile, your skin needs to be as free of germs as possible. You can reduce the number of germs on your skin by washing with CHG (chlorahexidine gluconate) Soap before surgery.  CHG is an antiseptic cleaner which kills germs and bonds with the skin to continue killing germs even after washing.  Please do not use if you have an allergy to CHG or  antibacterial soaps. If your skin becomes reddened/irritated stop using the CHG.  Do not shave (including legs and underarms) for at least 48 hours prior to first CHG shower. It is OK to shave your face.  Please follow these instructions carefully.   1. Shower the NIGHT BEFORE SURGERY and the MORNING OF SURGERY with CHG.   2. If you chose to wash your hair, wash your hair first as usual with your normal shampoo.  3. After you shampoo, rinse your hair and body thoroughly to remove the shampoo.  4. Use CHG as you would any other liquid soap. You can apply CHG directly to the skin and wash gently with a scrungie or a clean washcloth.   5. Apply the CHG Soap to your body ONLY FROM THE NECK DOWN.  Do not use on open wounds or open sores. Avoid contact with your eyes, ears, mouth and genitals (private parts). Wash Face and genitals (private parts)  with your normal soap.  6. Wash thoroughly, paying special attention to the area where your surgery will be performed.  7. Thoroughly rinse your body with warm water from the neck down.  8. DO NOT shower/wash with your normal soap after using and rinsing off the CHG Soap.  9. Pat yourself dry with a CLEAN TOWEL.  10. Wear CLEAN PAJAMAS to bed the night before surgery, wear comfortable clothes the morning of surgery  11. Place CLEAN SHEETS on your bed the night  of your first shower and DO NOT SLEEP WITH PETS.    Day of Surgery: Do not apply any deodorants/lotions. Please wear clean clothes to the hospital/surgery center.      Please read over the following fact sheets that you were given.

## 2016-12-10 ENCOUNTER — Encounter (HOSPITAL_COMMUNITY): Payer: Self-pay

## 2016-12-10 NOTE — Progress Notes (Addendum)
Anesthesia Chart Review: Patient is a 51 year old female scheduled for double ostectomy, hammer toe correction second toe, capsulotomy MPJ release joint left on 12/11/16 by Daylene Katayama, DPM. Case is posted for MAC anesthesia. Patient has an up-to-date office visit from 11/25/16 and cardiac clearanc (for H&P) from 11/21/16.  History includes never smoker, CAD (no significant CAD 63/1497 cath), diastolic CHF, murmur, post-operative N/V, undifferentiated connective tissue disease (lupus ?), fibromyalgia, HTN, GERD, hiatal hernia, dyspnea, moderate OSA, anxiety, depression, endometrial ablation, tonsillectomy, cholecystectomy. She had mild pulmonary hypertension and normal coronaries by 10/2016 cardiac cath. Reports her mother woke up during general anesthesia.   - PCP is Burnett Harry, MD at [facility] [place] (Care Everywhere). Last visit 11/25/16. - Cardiologist is Newman Pies, MD at [facility]). Last visit 11/21/16 with Jackquline Denmark, PA-C. She wrote, "From the standpoint of elective foot surgery, she may proceed without additional cardiac workup. Ejection fraction is normal with normal coronaries. Caution should be taken in the perioperative period to avoid hypervolemia."  - Pulmonologist is Dr. Frazier Richards ([place]; [place]).   Meds include clonazepam, Lasix, Plaquenil, losartan, Toprol XL, Prilosec, KCl, trazodone.  BP 137/74   Pulse 78   Temp 36.9 C   Resp 20   Ht 5' (1.524 m)   Wt 198 lb 3.2 oz (89.9 kg)   SpO2 97%   BMI 38.71 kg/m    EKG 10/07/16 [facility]): Requested from Dr. Madelin Rear office. By her notes, EKG showed, "EKG: Normal sinus rhythm with inverted T waves. Inferior infarct? Abnormal     RHC/LHC 10/27/16 ([place]): Cardiac Index (l/min/m2) 2.9 L/min/m2  Right Atrium Mean Pressure (mmHg) 15 mmHg  Right Ventricle Systolic Pressure (mmHg) 45 mmHg   Pulmonary Artery Mean Pressure (mmHg) 37 mmHg  Pulmonary Wedge Pressure (mmHg) 22 mmHg  Pulmonary Vascular Resistance (Wood units) 2.8 Wood units  Impressions: No significant CAD RA 12-70mHg RV 45/12 PA 45/25 (mean 335mg) PCWP 2233m LVEDP 68m26mMild Pulmonary hypertension (PVR 2.8WU) Preserved cardiac index (2.9 L/min/m2) Impression Diastolic dysfunction/restrictive filling  Echo [facility]/16 ([facility] due to chest wall/lung interference  Normal left ventricular systolic function, ejection fraction 65 to 70%  Normal right ventricular systolic function  CXR 9/110/26/37C [place]:  Lungs well inflated. No pulmonary edema, or focal consolidation. No pneumothoraces or pleural effusions. Eventration of right hemidiaphragm, unchanged. Cardiomediastinal silhouette within normal limits.  No acute osseous abnormalities.Unchanged surgical clips in right upper quadrant and epigastrium.  PFTs 10/22/16: FEV1 1.75 (pre 79.3%, post 85.3%), FVC 2.21 (pre 84.2%, post 90.8%), DLCO 18.32 (100%)  Preoperative labs noted.   If no acute changes then I anticipate that she can proceed as planned. If EKG tracing not received then she will need a baseline EKG on arrival.  [facility] Phone ([phone]/2018 1:28 PM

## 2016-12-11 ENCOUNTER — Encounter (HOSPITAL_COMMUNITY)
Admission: RE | Disposition: A | Discharge status: Home / Self Care | Payer: Self-pay | Source: Ambulatory Visit | Attending: Podiatry

## 2016-12-11 ENCOUNTER — Ambulatory Visit (HOSPITAL_COMMUNITY): Payer: BLUE CROSS/BLUE SHIELD | Admitting: Emergency Medicine

## 2016-12-11 ENCOUNTER — Encounter (HOSPITAL_COMMUNITY): Payer: Self-pay | Admitting: Urology

## 2016-12-11 ENCOUNTER — Ambulatory Visit (HOSPITAL_COMMUNITY)
Admission: RE | Admit: 2016-12-11 | Discharge: 2016-12-11 | Disposition: A | Discharge status: Home / Self Care | Payer: BLUE CROSS/BLUE SHIELD | Source: Ambulatory Visit | Attending: Podiatry | Admitting: Podiatry

## 2016-12-11 ENCOUNTER — Ambulatory Visit (HOSPITAL_COMMUNITY): Payer: BLUE CROSS/BLUE SHIELD | Admitting: Certified Registered"

## 2016-12-11 ENCOUNTER — Encounter: Payer: Self-pay | Admitting: Podiatry

## 2016-12-11 DIAGNOSIS — M1990 Unspecified osteoarthritis, unspecified site: Secondary | ICD-10-CM | POA: Insufficient documentation

## 2016-12-11 DIAGNOSIS — D649 Anemia, unspecified: Secondary | ICD-10-CM | POA: Insufficient documentation

## 2016-12-11 DIAGNOSIS — M2042 Other hammer toe(s) (acquired), left foot: Secondary | ICD-10-CM | POA: Diagnosis not present

## 2016-12-11 DIAGNOSIS — K449 Diaphragmatic hernia without obstruction or gangrene: Secondary | ICD-10-CM | POA: Insufficient documentation

## 2016-12-11 DIAGNOSIS — M2012 Hallux valgus (acquired), left foot: Secondary | ICD-10-CM | POA: Diagnosis not present

## 2016-12-11 DIAGNOSIS — I2510 Atherosclerotic heart disease of native coronary artery without angina pectoris: Secondary | ICD-10-CM | POA: Insufficient documentation

## 2016-12-11 DIAGNOSIS — I509 Heart failure, unspecified: Secondary | ICD-10-CM | POA: Insufficient documentation

## 2016-12-11 DIAGNOSIS — G4730 Sleep apnea, unspecified: Secondary | ICD-10-CM | POA: Insufficient documentation

## 2016-12-11 DIAGNOSIS — M797 Fibromyalgia: Secondary | ICD-10-CM | POA: Insufficient documentation

## 2016-12-11 DIAGNOSIS — M21542 Acquired clubfoot, left foot: Secondary | ICD-10-CM | POA: Diagnosis not present

## 2016-12-11 DIAGNOSIS — I110 Hypertensive heart disease with heart failure: Secondary | ICD-10-CM | POA: Insufficient documentation

## 2016-12-11 DIAGNOSIS — K219 Gastro-esophageal reflux disease without esophagitis: Secondary | ICD-10-CM | POA: Insufficient documentation

## 2016-12-11 HISTORY — PX: HAMMER TOE SURGERY: SHX385

## 2016-12-11 HISTORY — DX: Unspecified diastolic (congestive) heart failure: I50.30

## 2016-12-11 HISTORY — PX: OSTECTOMY: SHX6439

## 2016-12-11 HISTORY — PX: CAPSULOTOMY METATARSOPHALANGEAL: SHX6614

## 2016-12-11 SURGERY — OSTECTOMY
Anesthesia: Monitor Anesthesia Care | Laterality: Left

## 2016-12-11 MED ORDER — PROPOFOL 10 MG/ML IV BOLUS
INTRAVENOUS | Status: AC
  Filled 2016-12-11: qty 20

## 2016-12-11 MED ORDER — BUPIVACAINE HCL (PF) 0.5 % IJ SOLN
INTRAMUSCULAR | Status: AC
  Filled 2016-12-11: qty 30

## 2016-12-11 MED ORDER — CHLORHEXIDINE GLUCONATE 4 % EX LIQD
60.0000 mL | Freq: Once | CUTANEOUS | Status: DC
Start: 2016-12-11 — End: 2016-12-11

## 2016-12-11 MED ORDER — LIDOCAINE HCL 2 % IJ SOLN
INTRAMUSCULAR | Status: AC
  Filled 2016-12-11: qty 20

## 2016-12-11 MED ORDER — FENTANYL CITRATE (PF) 250 MCG/5ML IJ SOLN
INTRAMUSCULAR | Status: AC
  Filled 2016-12-11: qty 5

## 2016-12-11 MED ORDER — PHENYLEPHRINE HCL 10 MG/ML IJ SOLN
INTRAMUSCULAR | Status: DC | PRN
  Administered 2016-12-11 (×2): 80 ug via INTRAVENOUS

## 2016-12-11 MED ORDER — MIDAZOLAM HCL 2 MG/2ML IJ SOLN
INTRAMUSCULAR | Status: AC
  Filled 2016-12-11: qty 2

## 2016-12-11 MED ORDER — BUPIVACAINE HCL (PF) 0.5 % IJ SOLN
INTRAMUSCULAR | Status: DC | PRN
  Administered 2016-12-11: 8 mL

## 2016-12-11 MED ORDER — MIDAZOLAM HCL 5 MG/5ML IJ SOLN
INTRAMUSCULAR | Status: DC | PRN
  Administered 2016-12-11: 2 mg via INTRAVENOUS

## 2016-12-11 MED ORDER — DIPHENHYDRAMINE HCL 50 MG/ML IJ SOLN
INTRAMUSCULAR | Status: DC | PRN
  Administered 2016-12-11: 25 mg via INTRAVENOUS

## 2016-12-11 MED ORDER — 0.9 % SODIUM CHLORIDE (POUR BTL) OPTIME
TOPICAL | Status: DC | PRN
  Administered 2016-12-11: 1000 mL

## 2016-12-11 MED ORDER — LACTATED RINGERS IV SOLN
INTRAVENOUS | Status: DC
  Administered 2016-12-11: 12:00:00 via INTRAVENOUS

## 2016-12-11 MED ORDER — LIDOCAINE HCL 2 % IJ SOLN
INTRAMUSCULAR | Status: DC | PRN
  Administered 2016-12-11: 8 mL

## 2016-12-11 MED ORDER — ONDANSETRON HCL 4 MG/2ML IJ SOLN
INTRAMUSCULAR | Status: DC | PRN
  Administered 2016-12-11: 4 mg via INTRAVENOUS

## 2016-12-11 MED ORDER — CLINDAMYCIN PHOSPHATE 900 MG/50ML IV SOLN
900.0000 mg | INTRAVENOUS | Status: AC
  Administered 2016-12-11: 900 mg via INTRAVENOUS
  Filled 2016-12-11: qty 50

## 2016-12-11 MED ORDER — ONDANSETRON HCL 4 MG/2ML IJ SOLN
INTRAMUSCULAR | Status: AC
  Filled 2016-12-11: qty 2

## 2016-12-11 MED ORDER — EPHEDRINE SULFATE 50 MG/ML IJ SOLN
INTRAMUSCULAR | Status: DC | PRN
  Administered 2016-12-11: 7.5 mg via INTRAVENOUS
  Administered 2016-12-11: 5 mg via INTRAVENOUS
  Administered 2016-12-11: 7.5 mg via INTRAVENOUS

## 2016-12-11 MED ORDER — PROPOFOL 500 MG/50ML IV EMUL
INTRAVENOUS | Status: DC | PRN
  Administered 2016-12-11: 100 ug/kg/min via INTRAVENOUS

## 2016-12-11 MED ORDER — FENTANYL CITRATE (PF) 100 MCG/2ML IJ SOLN: 25.0000 ug | INTRAMUSCULAR | Status: DC | PRN

## 2016-12-11 MED ORDER — KETOROLAC TROMETHAMINE 30 MG/ML IJ SOLN
INTRAMUSCULAR | Status: DC | PRN
  Administered 2016-12-11: 30 mg via INTRAVENOUS

## 2016-12-11 MED ORDER — FENTANYL CITRATE (PF) 100 MCG/2ML IJ SOLN
INTRAMUSCULAR | Status: DC | PRN
  Administered 2016-12-11 (×2): 50 ug via INTRAVENOUS

## 2016-12-11 MED ORDER — DIPHENHYDRAMINE HCL 50 MG/ML IJ SOLN
INTRAMUSCULAR | Status: AC
  Filled 2016-12-11: qty 1

## 2016-12-11 SURGICAL SUPPLY — 56 items
BANDAGE ACE 4X5 VEL STRL LF (GAUZE/BANDAGES/DRESSINGS) ×3 IMPLANT
BIT DRILL 1.5 (BIT) ×1 IMPLANT
BIT DRILL 2.0 HCS 150 (BIT) ×3 IMPLANT
BLADE AVERAGE 25MMX9MM (BLADE) ×1
BLADE AVERAGE 25X9 (BLADE) ×2 IMPLANT
BLADE SURG 15 STRL LF DISP TIS (BLADE) ×3 IMPLANT
BLADE SURG 15 STRL SS (BLADE) ×6
BNDG ESMARK 4X9 LF (GAUZE/BANDAGES/DRESSINGS) ×3 IMPLANT
BNDG GAUZE ELAST 4 BULKY (GAUZE/BANDAGES/DRESSINGS) ×3 IMPLANT
BNDG STRETCH 4X75 NS LF (GAUZE/BANDAGES/DRESSINGS) ×3 IMPLANT
CAP PIN PROTECTOR ORTHO WHT (CAP) ×3 IMPLANT
CHLORAPREP W/TINT 26ML (MISCELLANEOUS) ×3 IMPLANT
CLOSURE STERI-STRIP 1/2X4 (GAUZE/BANDAGES/DRESSINGS) ×2
CLSR STERI-STRIP ANTIMIC 1/2X4 (GAUZE/BANDAGES/DRESSINGS) ×4 IMPLANT
COVER SURGICAL LIGHT HANDLE (MISCELLANEOUS) ×3 IMPLANT
CUFF TOURNIQUET SINGLE 18IN (TOURNIQUET CUFF) ×3 IMPLANT
DRAPE EXTREMITY T 121X128X90 (DRAPE) ×3 IMPLANT
DRAPE OEC MINIVIEW 54X84 (DRAPES) ×3 IMPLANT
DRILL BIT 1.5 (BIT) ×2
ELECT REM PT RETURN 9FT ADLT (ELECTROSURGICAL) ×3
ELECTRODE REM PT RTRN 9FT ADLT (ELECTROSURGICAL) ×1 IMPLANT
GAUZE SPONGE 4X4 12PLY STRL (GAUZE/BANDAGES/DRESSINGS) ×6 IMPLANT
GAUZE XEROFORM 1X8 LF (GAUZE/BANDAGES/DRESSINGS) ×3 IMPLANT
GLOVE BIO SURGEON STRL SZ8 (GLOVE) ×3 IMPLANT
GLOVE BIOGEL PI IND STRL 8 (GLOVE) ×1 IMPLANT
GLOVE BIOGEL PI INDICATOR 8 (GLOVE) ×2
GOWN STRL REUS W/ TWL LRG LVL3 (GOWN DISPOSABLE) ×3 IMPLANT
GOWN STRL REUS W/ TWL XL LVL3 (GOWN DISPOSABLE) ×1 IMPLANT
GOWN STRL REUS W/TWL LRG LVL3 (GOWN DISPOSABLE) ×6
GOWN STRL REUS W/TWL XL LVL3 (GOWN DISPOSABLE) ×2
K-WIRE NON THREAD 1.1 (WIRE) ×6
K-WIRE PROS 0.8X70 (WIRE) ×3
KIT BASIN OR (CUSTOM PROCEDURE TRAY) ×3 IMPLANT
KIT ROOM TURNOVER OR (KITS) ×3 IMPLANT
KWIRE NON THREAD 1.1 (WIRE) ×2 IMPLANT
KWIRE PROS 0.8X70 (WIRE) ×1 IMPLANT
NDL SAFETY ECLIPSE 18X1.5 (NEEDLE) ×1 IMPLANT
NEEDLE HYPO 18GX1.5 SHARP (NEEDLE) ×2
NEEDLE HYPO 25GX1X1/2 BEV (NEEDLE) ×6 IMPLANT
NS IRRIG 1000ML POUR BTL (IV SOLUTION) ×3 IMPLANT
PACK ORTHO EXTREMITY (CUSTOM PROCEDURE TRAY) ×3 IMPLANT
PAD ARMBOARD 7.5X6 YLW CONV (MISCELLANEOUS) ×6 IMPLANT
PAD CAST 4YDX4 CTTN HI CHSV (CAST SUPPLIES) ×1 IMPLANT
PADDING CAST COTTON 4X4 STRL (CAST SUPPLIES) ×2
SCREW CORTEX SLFTPNG 14MM (Screw) ×3 IMPLANT
SCREW HEADLESS COMPRESS 14X3.0 (Screw) ×3 IMPLANT
SCREW HEADLESS COMPRESS 16X3.0 (Screw) ×3 IMPLANT
SCREW SHORT THREAD 22MM 3.0 HC (Screw) ×3 IMPLANT
STAPLER VISISTAT (STAPLE) ×6 IMPLANT
SUT VIC AB 3-0 FS2 27 (SUTURE) ×3 IMPLANT
SUT VIC AB 4-0 PS2 18 (SUTURE) ×6 IMPLANT
SYR BULB 3OZ (MISCELLANEOUS) ×3 IMPLANT
SYR CONTROL 10ML LL (SYRINGE) ×3 IMPLANT
TOWEL OR 17X24 6PK STRL BLUE (TOWEL DISPOSABLE) ×3 IMPLANT
TOWEL OR 17X26 10 PK STRL BLUE (TOWEL DISPOSABLE) ×3 IMPLANT
UNDERPAD 30X30 (UNDERPADS AND DIAPERS) ×3 IMPLANT

## 2016-12-11 NOTE — Transfer of Care (Signed)
Immediate Anesthesia Transfer of Care Note  Patient: Dana Potts  Procedure(s) Performed: DOUBLE OSTECTOMY (Left ) HAMMER TOE CORRECTION SECOND LEFT (Left ) CAPSULOTOMY MPJ RELEASE JOINT (Left )  Patient Location: PACU  Anesthesia Type:MAC  Level of Consciousness: awake, alert , oriented and patient cooperative  Airway & Oxygen Therapy: Patient Spontanous Breathing and Patient connected to nasal cannula oxygen  Post-op Assessment: Report given to RN and Post -op Vital signs reviewed and stable  Post vital signs: Reviewed and stable  Last Vitals:  Vitals:   12/11/16 1156  BP: (!) 150/75  Pulse: 80  Resp: 20  Temp: 36.6 C  SpO2: 98%    Last Pain:  Vitals:   12/11/16 1156  TempSrc: Oral         Complications: No apparent anesthesia complications

## 2016-12-11 NOTE — Progress Notes (Signed)
Orthopedic Tech Progress Note Patient Details:  Dana Potts [place] 05/07/1965 [id number]  Ortho Devices Type of Ortho Device: CAM walker Ortho Device/Splint Location: [facility] cam walker [facility]     Alvina ChouWilliams, Mindee Robledo C 12/11/2016, 3:26 PM

## 2016-12-11 NOTE — Brief Op Note (Signed)
12/11/2016  3:21 PM  PATIENT:  Dana Potts  51 y.o. female  PRE-OPERATIVE DIAGNOSIS:  hammertoe hallux abductovalgus  POST-OPERATIVE DIAGNOSIS:  hammertoe hallux abductovalgus  PROCEDURE:  Procedure(s): DOUBLE OSTECTOMY (Left) HAMMER TOE CORRECTION SECOND LEFT (Left) CAPSULOTOMY MPJ RELEASE JOINT (Left)  SURGEON:  Surgeon(s) and Role:    Edrick Kins, DPM - Primary  PHYSICIAN ASSISTANT:   ASSISTANTS: none   ANESTHESIA:   local and MAC  EBL:  20 mL   BLOOD ADMINISTERED:none  DRAINS: none   LOCAL MEDICATIONS USED:  BUPIVICAINE  and XYLOCAINE   SPECIMEN:  No Specimen  DISPOSITION OF SPECIMEN:  N/A  COUNTS:  YES  TOURNIQUET:   Total Tourniquet Time Documented: Calf (Left) - 102 minutes Total: Calf (Left) - 102 minutes   DICTATION: .Viviann Spare Dictation  PLAN OF CARE: Discharge to home after PACU  PATIENT DISPOSITION:  PACU - hemodynamically stable.   Delay start of Pharmacological VTE agent (>24hrs) due to surgical blood loss or risk of bleeding: not applicable  Edrick Kins, DPM [facility]  Dr. Edrick Kins, DPM    [address], [place] [address]                Office [phone]  Fax [phone]

## 2016-12-11 NOTE — Anesthesia Postprocedure Evaluation (Signed)
Anesthesia Post Note  Patient: Dana Potts  Procedure(s) Performed: DOUBLE OSTECTOMY (Left ) HAMMER TOE CORRECTION SECOND LEFT (Left ) CAPSULOTOMY MPJ RELEASE JOINT (Left )     Patient location during evaluation: PACU Anesthesia Type: MAC Level of consciousness: awake Pain management: pain level controlled Vital Signs Assessment: post-procedure vital signs reviewed and stable Respiratory status: spontaneous breathing Cardiovascular status: stable Anesthetic complications: no    Last Vitals:  Vitals:   12/11/16 1530 12/11/16 1535  BP: 113/85   Pulse: 72 73  Resp: 17 17  Temp:    SpO2: 98% 96%    Last Pain:  Vitals:   12/11/16 1522  TempSrc:   PainSc: 0-No pain                 [name]

## 2016-12-11 NOTE — H&P (Signed)
Patient presents for bunionectomy and hammertoe correction left foot. No change in past medical history since physical history performed by primary care physician.    Subjective:  51 year old female presents today for follow-up evaluation regarding bunions and hammertoes to the left foot. Patient states that she when she was partially 51 years old she had bunion surgery performed by an orthopedist. Patient has complained of painful bunion deformity for several years now without any alleviation of symptoms despite all conservative modalities. Patient states that over the past 6 months she had family medical issues and has been unable to have surgery. Patient presents today for follow-up treatment and evaluation   Objective/Physical Exam General: The patient is alert and oriented x3 in no acute distress.  Dermatology: Skin is warm, dry and supple bilateral lower extremities. Negative for open lesions or macerations.  Vascular: Palpable pedal pulses bilaterally. No edema or erythema noted. Capillary refill within normal limits.  Neurological: Epicritic and protective threshold grossly intact bilaterally.   Musculoskeletal Exam: Clinical evidence of bunion deformity noted to the left foot with hammertoe contracture second digit left foot. There is limited range of motion of the first MPJ left foot.  Radiographic Exam:  Hallux abductovalgus deformity noted with an intermetatarsal angle greater than 15. Hallux abductus angle greater than 30. 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 #2 today we discussed conservative versus surgical management of bunion and hammertoe deformity again . All possible complications and details of the procedure were explained. All patient questions were answered. No guarantees were expressed or implied. #3  authorization for surgery was re-initiated today. Surgery will consist of bunionectomy with metatarsal osteotomy left. PIPJ arthroplasty second digit left. Second MPJ capsulotomy left. possible Weil osteotomy second metatarsal left foot #4 return to clinic 1 week postop  Hus[facility] from [facility]   Rowin Bayron M. Anais Koenen, [place]PM [facility]  Dr. Felecia ShellingBrent M. Wilmer Santillo, DPM    [address]                                        [place], [place] [address]                Office [phone]  Fax [phone]

## 2016-12-11 NOTE — Discharge Instructions (Signed)
See enclosed [facility] Instructions after Surgery

## 2016-12-11 NOTE — Anesthesia Preprocedure Evaluation (Addendum)
Anesthesia Evaluation  Patient identified by MRN, date of birth, ID band  Reviewed: Allergy & Precautions, NPO status , Patient's Chart, lab work & pertinent test results  History of Anesthesia Complications (+) PONV and Family history of anesthesia reaction  Airway Mallampati: II  TM Distance: >3 FB     Dental   Pulmonary shortness of breath, sleep apnea ,    breath sounds clear to auscultation       Cardiovascular hypertension, + CAD and +CHF  + Valvular Problems/Murmurs  Rhythm:Regular Rate:Normal     Neuro/Psych    GI/Hepatic Neg liver ROS, hiatal hernia, GERD  ,  Endo/Other    Renal/GU negative Renal ROS     Musculoskeletal  (+) Arthritis , Fibromyalgia -  Abdominal   Peds  Hematology  (+) anemia ,   Anesthesia Other Findings   Reproductive/Obstetrics                            Anesthesia Physical Anesthesia Plan  ASA: III  Anesthesia Plan: MAC   Post-op Pain Management:    Induction: Intravenous  PONV Risk Score and Plan: 3 and 4 or greater and Treatment may vary due to age or medical condition, Ondansetron and Dexamethasone  Airway Management Planned: Simple Face Mask  Additional Equipment:   Intra-op Plan:   Post-operative Plan:   Informed Consent: I have reviewed the patients History and Physical, chart, labs and discussed the procedure including the risks, benefits and alternatives for the proposed anesthesia with the patient or authorized representative who has indicated his/her understanding and acceptance.   Dental advisory given  Plan Discussed with: CRNA and Anesthesiologist  Anesthesia Plan Comments:         Anesthesia Quick Evaluation

## 2016-12-12 ENCOUNTER — Encounter: Payer: Self-pay | Admitting: Podiatry

## 2016-12-12 NOTE — Progress Notes (Signed)
Patient called with complaint of the dressing feeling tight. States the toe has capillary refill but she can't wiggle it as much as she could and has been having increasing pain. Advised her to loosen the ACE bandage and stop elevating the foot for a little bit to see if it helps. Advised not to remove the sterile dressing. Patient advised to put the ACE bandage back on and continue elevating. Advised to call back if she feels it isn't helping.

## 2016-12-13 NOTE — Op Note (Signed)
OPERATIVE REPORT Patient name: Dana Potts MRN: [id number] DOB: 09-Jul-1965  DOS:  12/11/2016   Preop Dx: Bunion deformity with hammertoe second digit left foot Postop Dx: same  Procedure:  1. Bunionectomy with double osteotomy left foot 2. PIPJ arthroplasty second digit left foot 3. Metatarsal phalangeal joint capsulotomy second digit left foot 4. W second metatarsal decompression osteotomy left footeil   Surgeon: Felecia Shelling DPM  Anesthesia: 50-50 mixture of 2% lidocaine plain with 0.5% Marcaine plain totaling 16 mL infiltrated in the patient's leftower extremity  Hemostasis: Ankle tourniquet inflated to a pressure of after esmarch exsanguination   EBL: 20  mL Materials: Synthes 3.0 mm headless compression screw 3. Synthes 2.0 mm solid cortical screw 1 Injectables: None Pathology: None  Condition: The patient tolerated the procedure and anesthesia well. No complications noted or reported   Justification for procedure: The patient is a 51 y.o. female who presents today for surgical correbunion deformity and hammertoe to the second digit left foot. All conservative modalities of been unsuccessful in providing any sort of satisfactory alleviation of symptoms with the patient. The patient was told benefits as well as possible side effects of the surgery. The patient consented for surgical correction. The patient consent form was reviewed. All patient questions were answered. No guarantees were expressed or implied. The patient and the surgeon boson the patient consent form with the witness present and placed in the patient's chart.   Procedure in Detail: The patient was brought to the operating room, placed in the operating table in the supine position at which time an aseptic scrub and drape were performed about the patient's respective lower extremity after anesthesia was induced as described above. Attention was then directed to the surgical area where  procedure number one commenced.  Procedure #1:  Bunionectomy with double osteotomy left foot   A 4 cm linear longitudinal skin incision was planned and made overlying the first metatarsal phalangeal joint of left foot. The incision was carried down the level of joint capsule with care taken to cut clamped ligated or retracted way all small neurovascular structures traversing the incision site. At this time an inverted L capsulotomy was performed at the metatarsal phalangeal joint. Careful capsular and periosteal resection was performed using a surgical #15 blade and a McGlamry elevator was utilized to release the lateral capsule and the lateral fibular sesamoidal ligament of the metatarsal phalangeal joint and the hypertrophic eminence about the medial aspect of the first metatarsal was resected away using a sagittal saw. At this time a chevron osteotomy was performed of the first metatarsal with the dorsal arm exiting the dorsal cortex more proximal than the plantar osteotomy in essence creating a long dorsal arm to facilitate better internal fixation. The capital fragment of the first metatarsal was translocated laterally to correct for a pathologically increased intermetatarsal angle. Permanent internal fixation was utilized using Synthes 3.0 mm headless compression screws 2. Intraoperative x-ray fluoroscopy was utilized to visualize reduction of the intermetatarsal angle as well as placement of the screws which was satisfactory. The screws were inserted in the standard AO fashion. The cortical step-off which is been created due to the translocation was then resected away using a sagittal saw.   intraoperative evaluation indicated a hallux abductus angle which needed to be corrected 4. At this time careful dissection was utilized [place] the proximal phalanx of the great toe. A closing wedge osteotomy was performed with an oblique orientation and the wedge of bone was removed  and the hallux abductus angle was  reduced and corrected for with permanent internal fixation using a 3.0 mm headless compression screw. Again intraoperative x-ray fluoroscopy was utilized to visualize the reduction of the hallux abductus angle as well as the placement of the screw which was satisfactory. The screw was inserted in standard AO fashion. Final intraoperative x-ray fluoroscopy was utilized to visualize the first ray which was satisfactory. Copious irrigation was utilized and the incision site was dried in preparation for routine layered soft tissue closure. 3-0 Vicryl suture was utilized to remove reapproximate the capsular tissue after all redundant tissue was removed and resected away using a surgical blade in essence creating a medial capsulorrhaphy. 4-0 Vicryl suture was utilized to reapproximate subcuticular tissue followed by staples to skin staples to reapproximate superficial skin edges.  Procedure #2: PIPJ arthroplasty second digit left foot A linear longitudinal skin incision was made over the dorsal aspect of the second digit left foot bisecting the digit beginning just proximal to the metatarsal phalangeal joint and extended over the PIPJ. The incision was carried down to the level of the EDL tendon with care taken to cut clamped ligated or retracted way all small neurovascular structures traversing the incision site. A Z-plasty tenotomy was performed of the EDL tendon to expose the underlying joints. At this time careful dissection was utilized to expose the hypertrophic head of the proximal phalanx. This hypertrophic head was grasped with a perforating towel clamp and the head was removed at the surgical phalangeal neck. A Kelikian maneuver indicated more proximal pathology existed of the hammertoe warranting surgical correction of the level of the metatarsal phalangeal joint.  Procedure #3: Metatarsal phalangeal joint capsulotomy second digit left foot Exposure of the metatarsal phalangeal joint and dorsal  capsulotomy was performed using a surgical #15 blade. After release of the dorsal adhesions of the joint a McGlamry elevator was utilized to release the plantar plate. An additional Kelikian maneuver was utilized which indicated that the second metatarsal was elongated to an abnormal parabola warranting a decompression shortening osteotomy of the second metatarsal.  Procedure #4: Weil decompression osteotomy second metatarsal left foot Within the incision site previously mentioned exposure of the neck of the second metatarsal was performed using surgical #15 blade with care taken to cut 5 ligated or retracted way all small neurovascular structures traversing the incision site. At this time an osteotomy was performed and dorsal distal to plantar proximal orientation using a sagittal blade. The head of the second metatarsal was translocated proximal to decompress the second MPJ. A 2.0 mm Synthes solid screw was utilized for permanent internal fixation of the osteotomy. Intraoperative x-ray fluoroscopy was visualized which was satisfactory. The screw was inserted in the standard AO fashion. At this time a 0.045 inch K wire was inserted percutaneously driven through the second ray of the left foot for temporary fixation. The percutaneous portion of the K wire which was extending out of the distal tuft of the second digit was bent dorsally at a 90 and cut with a synthetic ball Placed over the percutaneous portion. Copious irrigation was again utilized in preparation for routine layered soft tissue closure. 3-0 Vicryl suture was utilized to reapproximate the opposing ends of the EDL tendon followed by 4-0 Vicryl suture to reapproximate subcuticular tissue followed by stainless steel skin staples to approximate superficial skin edges.  Dry sterile compressive dressings were then applied to all previously mentioned incision sites about the patient's lower extremity. The tourniquet which was used for hemostasis  was  deflated. All normal neurovascular responses including pink color and warmth returned all the digits of patient's lower extremity.  The patient was then transferred from the operating room to the recovery room having tolerated the procedure and anesthesia well. All vital signs are stable. After a brief stay in the recovery room the patient wdischargeduate prescriptions for analgesia. Verbal as well as written instructions were provided for the patient regarding wound care. The patient is to keep the dressings clean dry and intact until they are to follow surgeon Dr. Gala LewandowskyBrent Derrill Bagnell in the office upon discharge.   Felecia ShellingBrent M. Koriana Stepien, DPM [facility]  Dr. Felecia ShellingBrent M. Amari Burnsworth, DPM    [address]                                        [place], [place] [address]                Office [phone]  Fax [phone](336) [phone]

## 2016-12-15 ENCOUNTER — Encounter (HOSPITAL_COMMUNITY): Payer: Self-pay | Admitting: Podiatry

## 2016-12-16 ENCOUNTER — Encounter: Payer: Self-pay | Admitting: Podiatry

## 2016-12-16 ENCOUNTER — Ambulatory Visit (INDEPENDENT_AMBULATORY_CARE_PROVIDER_SITE_OTHER): Payer: BLUE CROSS/BLUE SHIELD | Admitting: Podiatry

## 2016-12-16 ENCOUNTER — Ambulatory Visit (INDEPENDENT_AMBULATORY_CARE_PROVIDER_SITE_OTHER): Payer: BLUE CROSS/BLUE SHIELD

## 2016-12-16 DIAGNOSIS — M2012 Hallux valgus (acquired), left foot: Secondary | ICD-10-CM

## 2016-12-16 DIAGNOSIS — M21619 Bunion of unspecified foot: Secondary | ICD-10-CM

## 2016-12-16 DIAGNOSIS — Z9889 Other specified postprocedural states: Secondary | ICD-10-CM

## 2016-12-16 DIAGNOSIS — M21612 Bunion of left foot: Secondary | ICD-10-CM

## 2016-12-21 NOTE — Progress Notes (Signed)
   Subjective:  Patient presents today status post left bunionectomy and hammertoe repair of the 2nd toe of the left foot. DOS: 12/11/16. She states she is doing well overall. She reports some redness around the incision site. She has no new complaints at this time. Patient is here for further evaluation and treatment.     Past Medical History:  Diagnosis Date  . Anemia    as a child   . Anxiety   . Arthritis    osteoarthritis, stenosed- Cspine- C4-5-6- previous fx    . Complication of anesthesia   . Coronary artery disease    no significant CAD by 10/2016 cardiac cath [facility]  . Depression    was treating with natural, started Lexapro - 12/08/2016  . Diastolic CHF (HCC)    new diagnosis   . Dyspnea   . Family history of adverse reaction to anesthesia    mother of pt. reports that she woke up undder general anesth  . Fibromyalgia   . GERD (gastroesophageal reflux disease)   . Heart murmur   . History of hiatal hernia    REPAIRED  . Hypertension   . Lupus   . PONV (postoperative nausea and vomiting)   . Sleep apnea    [facility]- 10/2016      Objective/Physical Exam Skin incisions appear to be well coapted with sutures and staples intact. No sign of infectious process noted. No dehiscence. No active bleeding noted. Moderate edema noted to the surgical extremity.  Radiographic Exam:  Orthopedic hardware and osteotomies sites appear to be stable with routine healing.  Assessment: 1. s/p left bunionectomy and hammertoe repair left 2nd toe. DOS: 12/11/16   Plan of Care:  1. Patient was evaluated. X-rays reviewed 2. Dressing changed. 3. Weightbearing in CAM boot.  4. Return to clinic in 1 week.    Felecia ShellingBrent M. Evans, DPM [facility]  Dr. Felecia ShellingBrent M. Evans, DPM    [address]                                        [place], [place] [address]                Office [phone]  Fax [phone](336) [phone]

## 2016-12-23 ENCOUNTER — Telehealth: Payer: Self-pay | Admitting: *Deleted

## 2016-12-23 NOTE — Telephone Encounter (Signed)
DOS :12/11/16 [id number]- Double Osteotomy(left) 28270-Capsulotomy,MPJ release joint(2nd toe left) 28285-Hammer Toe Repair(2nd toe left)

## 2016-12-26 ENCOUNTER — Encounter: Payer: BLUE CROSS/BLUE SHIELD | Admitting: Podiatry

## 2016-12-26 ENCOUNTER — Encounter: Payer: Self-pay | Admitting: Podiatry

## 2016-12-26 ENCOUNTER — Ambulatory Visit (INDEPENDENT_AMBULATORY_CARE_PROVIDER_SITE_OTHER): Payer: BLUE CROSS/BLUE SHIELD | Admitting: Podiatry

## 2016-12-26 DIAGNOSIS — Z9889 Other specified postprocedural states: Secondary | ICD-10-CM

## 2016-12-29 NOTE — Progress Notes (Signed)
   Subjective:  Patient presents today status post left bunionectomy and hammertoe repair of the 2nd toe of the left foot. DOS: 12/11/16. She reports improvement in the swelling of the foot. She expresses concern about the great toe "sticking up". Patient is here for further evaluation and treatment.     Past Medical History:  Diagnosis Date  . Anemia    as a child   . Anxiety   . Arthritis    osteoarthritis, stenosed- Cspine- C4-5-6- previous fx    . Complication of anesthesia   . Coronary artery disease    no significant CAD by 10/2016 cardiac cath [facility]  . Depression    was treating with natural, started Lexapro - 12/08/2016  . Diastolic CHF (HCC)    new diagnosis   . Dyspnea   . Family history of adverse reaction to anesthesia    mother of pt. reports that she woke up undder general anesth  . Fibromyalgia   . GERD (gastroesophageal reflux disease)   . Heart murmur   . History of hiatal hernia    REPAIRED  . Hypertension   . Lupus   . PONV (postoperative nausea and vomiting)   . Sleep apnea    [facility]- 10/2016      Objective/Physical Exam Skin incisions appear to be well coapted with sutures and staples intact. No sign of infectious process noted. No dehiscence. No active bleeding noted. Continued edema to the surgical foot with contributory CHF.   Assessment: 1. s/p left bunionectomy and hammertoe repair left 2nd toe. DOS: 12/11/16   Plan of Care:  1. Patient was evaluated.  2. Partial staples removed. 3. Orders for physical therapy placed to day for 3 times weekly for 4 weeks. 4. Continue nonweightbearing in CAM boot.  5. Return to clinic in 1 week.    Felecia ShellingBrent M. Evans, DPM [facility]  Dr. Felecia ShellingBrent M. Evans, DPM    [address]                                        [place], [place] [address]                Office [phone]([phone]  Fax [phone]([phone]

## 2017-01-02 ENCOUNTER — Encounter: Payer: Self-pay | Admitting: Podiatry

## 2017-01-02 ENCOUNTER — Ambulatory Visit (INDEPENDENT_AMBULATORY_CARE_PROVIDER_SITE_OTHER): Payer: BLUE CROSS/BLUE SHIELD | Admitting: Podiatry

## 2017-01-02 ENCOUNTER — Ambulatory Visit (INDEPENDENT_AMBULATORY_CARE_PROVIDER_SITE_OTHER): Payer: BLUE CROSS/BLUE SHIELD

## 2017-01-02 DIAGNOSIS — M2012 Hallux valgus (acquired), left foot: Secondary | ICD-10-CM | POA: Diagnosis not present

## 2017-01-02 DIAGNOSIS — M21612 Bunion of left foot: Secondary | ICD-10-CM

## 2017-01-02 DIAGNOSIS — M2042 Other hammer toe(s) (acquired), left foot: Secondary | ICD-10-CM

## 2017-01-02 DIAGNOSIS — Z9889 Other specified postprocedural states: Secondary | ICD-10-CM

## 2017-01-02 MED ORDER — GENTAMICIN SULFATE 0.1 % EX CREA: 1.0000 "application " | TOPICAL_CREAM | Freq: Three times a day (TID) | CUTANEOUS | 1 refills | Status: AC

## 2017-01-05 NOTE — Progress Notes (Signed)
   Subjective:  Patient presents today status post left bunionectomy and hammertoe repair of the 2nd toe of the left foot. DOS: 12/11/16.  She states she is doing well overall.  She reports the pain is improving.  There are no new complaints at this time.  Patient is here for further evaluation and treatment.     Past Medical History:  Diagnosis Date  . Anemia    as a child   . Anxiety   . Arthritis    osteoarthritis, stenosed- Cspine- C4-5-6- previous fx    . Complication of anesthesia   . Coronary artery disease    no significant CAD by 10/2016 cardiac cath [facility]  . Depression    was treating with natural, started Lexapro - 12/08/2016  . Diastolic CHF (HCC)    new diagnosis   . Dyspnea   . Family history of adverse reaction to anesthesia    mother of pt. reports that she woke up undder general anesth  . Fibromyalgia   . GERD (gastroesophageal reflux disease)   . Heart murmur   . History of hiatal hernia    REPAIRED  . Hypertension   . Lupus   . PONV (postoperative nausea and vomiting)   . Sleep apnea    [facility]- 10/2016      Objective/Physical Exam Skin incisions appear to be well coapted with sutures and staples intact. No sign of infectious process noted. No dehiscence. No active bleeding noted.  Improved edema to the surgical foot.   Radiographic Exam:  Orthopedic hardware and osteotomies sites appear to be stable with routine healing.  Assessment: 1. s/p left bunionectomy and hammertoe repair left 2nd toe. DOS: 12/11/16   Plan of Care:  1. Patient was evaluated.  X-rays reviewed. 2.  Remaining staples removed. 3.  Prescription for gentamicin cream to apply to incision sites and percutaneous pin provided to patient. 4.  Continue weightbearing in cam boot. 5.  Return to clinic in 1 week for pin removal.   Felecia ShellingBrent M. Tyheem Boughner, DPM [facility]  Dr. Felecia ShellingBrent M. Kysha Muralles, DPM    [address]                                        [place], [place] [address]                 Office [phone]  Fax [phone](336) [phone]

## 2017-01-09 ENCOUNTER — Ambulatory Visit (INDEPENDENT_AMBULATORY_CARE_PROVIDER_SITE_OTHER): Payer: BLUE CROSS/BLUE SHIELD | Admitting: Podiatry

## 2017-01-09 ENCOUNTER — Telehealth: Payer: Self-pay | Admitting: *Deleted

## 2017-01-09 ENCOUNTER — Ambulatory Visit (INDEPENDENT_AMBULATORY_CARE_PROVIDER_SITE_OTHER): Payer: BLUE CROSS/BLUE SHIELD

## 2017-01-09 DIAGNOSIS — M2042 Other hammer toe(s) (acquired), left foot: Secondary | ICD-10-CM

## 2017-01-09 DIAGNOSIS — Z9889 Other specified postprocedural states: Secondary | ICD-10-CM

## 2017-01-09 DIAGNOSIS — M2012 Hallux valgus (acquired), left foot: Secondary | ICD-10-CM

## 2017-01-09 DIAGNOSIS — M21612 Bunion of left foot: Secondary | ICD-10-CM

## 2017-01-09 MED ORDER — SULFAMETHOXAZOLE-TRIMETHOPRIM 800-160 MG PO TABS: 1.0000 | ORAL_TABLET | Freq: Two times a day (BID) | ORAL | 0 refills | Status: AC

## 2017-01-09 NOTE — Telephone Encounter (Signed)
Pt states Dr. Logan BoresEvans [facility] [place] [facility] [place] [facility], how does she go about getting scheduled. 01/09/2017-Left message informing pt [employer] - In-office would give her a call to schedule [place].

## 2017-01-12 NOTE — Progress Notes (Signed)
   Subjective:  Patient presents today status post left bunionectomy and hammertoe repair of the 2nd toe of the left foot. DOS: 12/11/16.  She states she is doing better.  She reports some mild drainage from the incision site.  She states her second toe is turning inward.  Patient is here for further evaluation and treatment.     Past Medical History:  Diagnosis Date  . Anemia    as a child   . Anxiety   . Arthritis    osteoarthritis, stenosed- Cspine- C4-5-6- previous fx    . Complication of anesthesia   . Coronary artery disease    no significant CAD by 10/2016 cardiac cath [facility])  . Depression    was treating with natural, started Lexapro - 12/08/2016  . Diastolic CHF (HCC)    new diagnosis   . Dyspnea   . Family history of adverse reaction to anesthesia    mother of pt. reports that she woke up undder general anesth  . Fibromyalgia   . GERD (gastroesophageal reflux disease)   . Heart murmur   . History of hiatal hernia    REPAIRED  . Hypertension   . Lupus   . PONV (postoperative nausea and vomiting)   . Sleep apnea    [facility]- 10/2016      Objective/Physical Exam Wound #1 noted to bunion incision site of the left foot measuring approximately 0.3 x 0.3 x 0.2 cm.  To the above-noted ulceration, there is no eschar. There is a moderate amount of slough, fibrin and necrotic tissue. Granulation tissue and wound base is red. There is no malodor. There is a minimal amount of serosanginous drainage noted. Periwound integrity is intact.   Radiographic Exam:  Orthopedic hardware and osteotomies sites appear to be stable with routine healing.  Assessment: 1. s/p left bunionectomy and hammertoe repair left 2nd toe. DOS: 12/11/16 2.  Ulcer to bunion incision site   Plan of Care:  1. Patient was evaluated.  X-rays reviewed. 2.  Prescription for Bactrim DS provided to patient. 3.  Continue using gentamicin cream daily. 4.  Postop shoe dispensed.  Discontinue wearing cam boot. 5.   Patient is starting physical therapy next week. 6.  Return to clinic in 2 weeks.   Felecia ShellingBrent M. Makelle Marrone, DPM [facility]  Dr. Felecia ShellingBrent M. Whitni Pasquini, DPM    [address]                                        [place], [place] [address]                Office [phone]  Fax [phone](336) [phone]

## 2017-01-23 ENCOUNTER — Telehealth: Payer: Self-pay | Admitting: Podiatry

## 2017-01-23 ENCOUNTER — Encounter: Payer: Self-pay | Admitting: Podiatry

## 2017-01-23 ENCOUNTER — Ambulatory Visit (INDEPENDENT_AMBULATORY_CARE_PROVIDER_SITE_OTHER): Payer: BLUE CROSS/BLUE SHIELD | Admitting: Podiatry

## 2017-01-23 DIAGNOSIS — Z9889 Other specified postprocedural states: Secondary | ICD-10-CM

## 2017-01-23 DIAGNOSIS — L97522 Non-pressure chronic ulcer of other part of left foot with fat layer exposed: Secondary | ICD-10-CM

## 2017-01-23 DIAGNOSIS — M2012 Hallux valgus (acquired), left foot: Secondary | ICD-10-CM

## 2017-01-23 DIAGNOSIS — M21612 Bunion of left foot: Secondary | ICD-10-CM

## 2017-01-23 NOTE — Telephone Encounter (Signed)
This is Nicole Kindredngela Bell calling from [employer]. I need to confirm a first office visit on this pt with your facility. I'm showing the first visit was in January 2018 and I just need to confirm that she was not seen anytime in 2017. If you could call me back to confirm that information I would appreciate it. The phone number is [phone]. Thank you so much. Have a good day.

## 2017-01-26 LAB — WOUND CULTURE: Organism ID, Bacteria: NONE SEEN

## 2017-01-29 NOTE — Progress Notes (Signed)
   Subjective:  Patient presents today status post left bunionectomy and hammertoe repair of the 2nd toe of the left foot. DOS: 12/11/16. She is here for follow-up of an ulceration to the incision site. She states it is improving and the pain is minimal. Patient is here for further evaluation and treatment.     Past Medical History:  Diagnosis Date  . Anemia    as a child   . Anxiety   . Arthritis    osteoarthritis, stenosed- Cspine- C4-5-6- previous fx    . Complication of anesthesia   . Coronary artery disease    no significant CAD by 10/2016 cardiac cath [facility]  . Depression    was treating with natural, started Lexapro - 12/08/2016  . Diastolic CHF (HCC)    new diagnosis   . Dyspnea   . Family history of adverse reaction to anesthesia    mother of pt. reports that she woke up undder general anesth  . Fibromyalgia   . GERD (gastroesophageal reflux disease)   . Heart murmur   . History of hiatal hernia    REPAIRED  . Hypertension   . Lupus   . PONV (postoperative nausea and vomiting)   . Sleep apnea    [facility]- 10/2016      Objective/Physical Exam Wound #1 noted to bunion incision site of the left foot measuring approximately 0.2 x 0.2 x 0.2 cm.  To the above-noted ulceration, there is no eschar. There is a moderate amount of slough, fibrin and necrotic tissue. Granulation tissue and wound base is red. There is no malodor. There is a minimal amount of serosanginous drainage noted. Periwound integrity is intact.   Assessment: 1. s/p left bunionectomy and hammertoe repair left 2nd toe. DOS: 12/11/16 2.  Ulcer to bunion incision site   Plan of Care:  1. Patient was evaluated. 2. medically necessary excisional debridement including subcutaneous tissue was performed using a tissue nipper and a chisel blade. Excisional debridement of all the necrotic nonviable tissue down to healthy bleeding viable tissue was performed with post-debridement measurements same as pre-. 3. the  wound was cleansed and dry sterile dressing applied. 4.  Continue using gentamicin cream and applying Band-Aid daily. 5.  Continue physical therapy. 6.  Cultures taken today.  Finished oral Bactrim on 01/20/17. 7.  Transition into tennis shoes. 8.  Return to clinic in 2 weeks.     Felecia ShellingBrent M. Saber Dickerman, DPM [facility]  Dr. Felecia ShellingBrent M. Makar Slatter, DPM    [address]                                        [place], [place] [address]                Office [phone]([phone]  Fax [phone]([phone]

## 2017-02-06 ENCOUNTER — Ambulatory Visit (INDEPENDENT_AMBULATORY_CARE_PROVIDER_SITE_OTHER): Payer: BLUE CROSS/BLUE SHIELD | Admitting: Podiatry

## 2017-02-06 ENCOUNTER — Ambulatory Visit (INDEPENDENT_AMBULATORY_CARE_PROVIDER_SITE_OTHER): Payer: BLUE CROSS/BLUE SHIELD

## 2017-02-06 DIAGNOSIS — Z9889 Other specified postprocedural states: Secondary | ICD-10-CM

## 2017-02-06 DIAGNOSIS — M21612 Bunion of left foot: Secondary | ICD-10-CM

## 2017-02-06 DIAGNOSIS — M2012 Hallux valgus (acquired), left foot: Secondary | ICD-10-CM

## 2017-02-09 NOTE — Progress Notes (Signed)
   Subjective:  Patient presents today status post left bunionectomy and hammertoe repair of the 2nd toe of the left foot. DOS: 12/11/16.  She states she is doing better.  She reports some continued swelling to the left foot.  She reports pain after physical therapy sessions.  Patient is here for further evaluation and treatment.    Past Medical History:  Diagnosis Date  . Anemia    as a child   . Anxiety   . Arthritis    osteoarthritis, stenosed- Cspine- C4-5-6- previous fx    . Complication of anesthesia   . Coronary artery disease    no significant CAD by 10/2016 cardiac cath [facility]  . Depression    was treating with natural, started Lexapro - 12/08/2016  . Diastolic CHF (HCC)    new diagnosis   . Dyspnea   . Family history of adverse reaction to anesthesia    mother of pt. reports that she woke up undder general anesth  . Fibromyalgia   . GERD (gastroesophageal reflux disease)   . Heart murmur   . History of hiatal hernia    REPAIRED  . Hypertension   . Lupus   . PONV (postoperative nausea and vomiting)   . Sleep apnea    [facility]- 10/2016      Objective/Physical Exam Incisions have healed.  Edema significantly improved.   Neurovascular status intact  Radiographic Exam:  Orthopedic hardware and osteotomies sites appear to be stable with routine healing.  Assessment: 1. s/p left bunionectomy and hammertoe repair left 2nd toe. DOS: 12/11/16   Plan of Care:  1. Patient was evaluated.  X-rays reviewed. 2.  Slowly increase activity.  Transition back to full activity over the next 4 weeks. 3.  Return to clinic in 4 weeks to assess if she can return to work.    Felecia ShellingBrent M. Stacye Noori, DPM [facility]  Dr. Felecia ShellingBrent M. Lyan Holck, DPM    [address]                                        [place], [place] [address]                Office [phone]  Fax [phone](336) [phone]

## 2017-03-06 ENCOUNTER — Ambulatory Visit (INDEPENDENT_AMBULATORY_CARE_PROVIDER_SITE_OTHER): Payer: BLUE CROSS/BLUE SHIELD | Admitting: Podiatry

## 2017-03-06 ENCOUNTER — Encounter: Payer: Self-pay | Admitting: Podiatry

## 2017-03-06 ENCOUNTER — Ambulatory Visit (INDEPENDENT_AMBULATORY_CARE_PROVIDER_SITE_OTHER): Payer: BLUE CROSS/BLUE SHIELD

## 2017-03-06 DIAGNOSIS — M21612 Bunion of left foot: Secondary | ICD-10-CM

## 2017-03-06 DIAGNOSIS — Z9889 Other specified postprocedural states: Secondary | ICD-10-CM

## 2017-03-06 DIAGNOSIS — M2012 Hallux valgus (acquired), left foot: Secondary | ICD-10-CM

## 2017-03-09 NOTE — Progress Notes (Signed)
   Subjective:  Patient presents today status post left bunionectomy and hammertoe repair of the 2nd toe of the left foot. DOS: 12/11/16.  She states she is doing well overall. She denies any new complaints at this time. Patient is here for further evaluation and treatment.    Past Medical History:  Diagnosis Date  . Anemia    as a child   . Anxiety   . Arthritis    osteoarthritis, stenosed- Cspine- C4-5-6- previous fx    . Complication of anesthesia   . Coronary artery disease    no significant CAD by 10/2016 cardiac cath [facility]  . Depression    was treating with natural, started Lexapro - 12/08/2016  . Diastolic CHF (HCC)    new diagnosis   . Dyspnea   . Family history of adverse reaction to anesthesia    mother of pt. reports that she woke up undder general anesth  . Fibromyalgia   . GERD (gastroesophageal reflux disease)   . Heart murmur   . History of hiatal hernia    REPAIRED  . Hypertension   . Lupus   . PONV (postoperative nausea and vomiting)   . Sleep apnea    [facility]- 10/2016      Objective/Physical Exam Incisions have healed. No edema noted. Neurovascular status intact.  Radiographic Exam:  Orthopedic hardware and osteotomies sites appear to be stable with routine healing.  Assessment: 1. s/p left bunionectomy and hammertoe repair left 2nd toe. DOS: 12/11/16 - improved   Plan of Care:  1. Patient was evaluated. X-rays reviewed. 2. Full activity with no restrictions.  3. Recommended good shoe gear.  4. Return to clinic as needed.    Felecia ShellingBrent M. Naama Sappington, DPM [facility]  Dr. Felecia ShellingBrent M. Warwick Nick, DPM    [address]                                        [place], [place] [id number]                Office [phone]  Fax [phone]

## 2017-08-31 DIAGNOSIS — M79676 Pain in unspecified toe(s): Secondary | ICD-10-CM
